# Patient Record
Sex: Female | Born: 1985
Health system: Southern US, Community
[De-identification: ages and names within clinical notes are randomized; demographics above are authoritative.]

## PROBLEM LIST (undated history)

## (undated) DIAGNOSIS — E079 Disorder of thyroid, unspecified: Secondary | ICD-10-CM

## (undated) DIAGNOSIS — E039 Hypothyroidism, unspecified: Secondary | ICD-10-CM

## (undated) DIAGNOSIS — Z5189 Encounter for other specified aftercare: Secondary | ICD-10-CM

## (undated) DIAGNOSIS — R51 Headache: Secondary | ICD-10-CM

## (undated) HISTORY — DX: Hypothyroidism, unspecified: E03.9

## (undated) HISTORY — PX: DILATION AND CURETTAGE OF UTERUS: SHX78

## (undated) HISTORY — PX: WISDOM TOOTH EXTRACTION: SHX21

## (undated) HISTORY — DX: Encounter for other specified aftercare: Z51.89

---

## 2005-08-26 ENCOUNTER — Ambulatory Visit: Payer: Self-pay | Admitting: Internal Medicine

## 2006-04-21 ENCOUNTER — Encounter: Payer: Self-pay | Admitting: Internal Medicine

## 2006-04-21 ENCOUNTER — Encounter: Admission: RE | Admit: 2006-04-21 | Discharge: 2006-04-21 | Payer: Self-pay | Admitting: Internal Medicine

## 2006-04-21 ENCOUNTER — Ambulatory Visit: Payer: Self-pay | Admitting: Internal Medicine

## 2006-04-21 DIAGNOSIS — E049 Nontoxic goiter, unspecified: Secondary | ICD-10-CM | POA: Insufficient documentation

## 2006-04-21 DIAGNOSIS — D239 Other benign neoplasm of skin, unspecified: Secondary | ICD-10-CM | POA: Insufficient documentation

## 2006-04-21 LAB — CONVERTED CEMR LAB
BUN: 11 mg/dL (ref 6–23)
Basophils Relative: 0.4 % (ref 0.0–1.0)
CO2: 27 meq/L (ref 19–32)
Creatinine, Ser: 0.4 mg/dL (ref 0.4–1.2)
Eosinophils Relative: 1 % (ref 0.0–5.0)
Glucose, Bld: 101 mg/dL — ABNORMAL HIGH (ref 70–99)
HCT: 40.3 % (ref 36.0–46.0)
HDL: 41.6 mg/dL (ref >39.0)
Hemoglobin: 14.1 g/dL (ref 12.0–15.0)
LDL Cholesterol: 60 mg/dL (ref 0–99)
Lymphocytes Relative: 30.1 % (ref 12.0–46.0)
Monocytes Absolute: 0.5 10*3/uL (ref 0.2–0.7)
Monocytes Relative: 9.7 % (ref 3.0–11.0)
Potassium: 4.5 meq/L (ref 3.5–5.1)
RDW: 11.4 % — ABNORMAL LOW (ref 11.5–14.6)
T3, Free: 8.9 pg/mL — ABNORMAL HIGH (ref 2.3–4.2)
TSH: 0.05 microintl units/mL — ABNORMAL LOW (ref 0.35–5.50)
VLDL: 8 mg/dL (ref 0–40)
WBC: 5 10*3/uL (ref 4.5–10.5)

## 2006-04-27 ENCOUNTER — Encounter: Admission: RE | Admit: 2006-04-27 | Discharge: 2006-04-27 | Payer: Self-pay | Admitting: Internal Medicine

## 2006-05-19 ENCOUNTER — Encounter: Payer: Self-pay | Admitting: Internal Medicine

## 2006-05-19 ENCOUNTER — Ambulatory Visit: Payer: Self-pay | Admitting: Internal Medicine

## 2006-05-22 ENCOUNTER — Ambulatory Visit: Payer: Self-pay | Admitting: Endocrinology

## 2006-05-26 ENCOUNTER — Ambulatory Visit: Payer: Self-pay | Admitting: Internal Medicine

## 2006-06-02 ENCOUNTER — Ambulatory Visit (HOSPITAL_COMMUNITY): Admission: RE | Admit: 2006-06-02 | Discharge: 2006-06-02 | Payer: Self-pay | Admitting: Infectious Diseases

## 2006-06-16 ENCOUNTER — Encounter: Payer: Self-pay | Admitting: Internal Medicine

## 2006-07-21 ENCOUNTER — Encounter: Admission: RE | Admit: 2006-07-21 | Discharge: 2006-07-21 | Payer: Self-pay | Admitting: Endocrinology

## 2006-08-14 ENCOUNTER — Encounter: Admission: RE | Admit: 2006-08-14 | Discharge: 2006-08-14 | Payer: Self-pay | Admitting: Endocrinology

## 2006-10-17 ENCOUNTER — Encounter: Payer: Self-pay | Admitting: *Deleted

## 2006-10-17 DIAGNOSIS — E039 Hypothyroidism, unspecified: Secondary | ICD-10-CM | POA: Insufficient documentation

## 2009-07-20 ENCOUNTER — Emergency Department (HOSPITAL_COMMUNITY): Admission: EM | Admit: 2009-07-20 | Discharge: 2009-07-20 | Payer: Self-pay | Admitting: Emergency Medicine

## 2010-02-10 ENCOUNTER — Encounter: Payer: Self-pay | Admitting: Endocrinology

## 2010-04-07 LAB — URINALYSIS, ROUTINE W REFLEX MICROSCOPIC
Glucose, UA: NEGATIVE mg/dL
Leukocytes, UA: NEGATIVE
Protein, ur: 30 mg/dL — AB
Specific Gravity, Urine: 1.033 — ABNORMAL HIGH (ref 1.005–1.030)
Urobilinogen, UA: 0.2 mg/dL (ref 0.0–1.0)

## 2010-04-07 LAB — POCT I-STAT, CHEM 8
BUN: 16 mg/dL (ref 6–23)
Calcium, Ion: 1.08 mmol/L — ABNORMAL LOW (ref 1.12–1.32)
Creatinine, Ser: 0.7 mg/dL (ref 0.4–1.2)
TCO2: 22 mmol/L (ref 0–100)

## 2010-04-07 LAB — URINE MICROSCOPIC-ADD ON

## 2010-06-04 NOTE — Consult Note (Signed)
[facility]                          ENDOCRINOLOGY CONSULTATION   Kiara Juarez, Kiara Juarez                      MRN:          [id number]  DATE:05/22/2006                            DOB:          [birth year]    REFERRING PHYSICIAN:  Willow Ora, MD   REASON FOR REFERRAL:  Hyperthyroidism.   HISTORY OF PRESENT ILLNESS:  A 25 year old woman who states that has  been noted to have swelling at her anterior neck for some years now. She  states that more recently, she has slight anxiety, weight gain and some  associated heat intolerance.   PAST MEDICAL HISTORY:  Otherwise healthy. She takes no medications. Last  menstrual period was April 27, 2006 and states she is not at risk for  pregnancy.   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   FAMILY HISTORY:  Negative for thyroid disease.   REVIEW OF SYSTEMS:  Denies the following:  Fever, shortness of breath,  dysphagia, tremor and palpitations.   PHYSICAL EXAMINATION:  VITAL SIGNS:  Blood pressure 107/57, heart rate  is 89, temperature 98.4, the weight is 154.  GENERAL:  No distress.  SKIN:  Not diaphoretic, no rash.  HEENT:  No proptosis, no periorbital swelling.  NECK:  Irregular surface, slightly enlarged on the right.  CHEST:  Clear to auscultation, no respiratory distress.  CARDIOVASCULAR:  No edema. Regular rate and rhythm, no murmur.  NEUROLOGIC:  Alert and oriented. Does not appear anxious nor depressed  and there is no tremor.   LABORATORY DATA FORWARDED BY DR Drue Novel:  On April 21, 2006, TSH 0.05, free  T4 2.7, free T3 8.9.   Thyroid ultrasound shows multiple nodules, the largest of which are 2  nodules, each 11 mm diameter, one on each thyroid lobe.   IMPRESSION:  1. Small multinodular goiter.  2. Hyperthyroidism probably due to #1.  3. Weight gain which is occasionally seen in hyperthyroidism.   PLAN:  We discussed the natural history of  hyperthyroidism, risks, and  treatment options. She wants to have a nuclear medicine scan (which  would be preparatory for iodine-131 therapy), while she considers her  options. This is being scheduled and I will let her know by telephone  when the results are known.     Sean A. Everardo All, MD  Electronically Signed    SAE/MedQ  DD: 05/22/2006  DT: 05/22/2006  Job #: [id number]   cc:   Willow Ora, MD

## 2011-03-10 ENCOUNTER — Ambulatory Visit (INDEPENDENT_AMBULATORY_CARE_PROVIDER_SITE_OTHER): Payer: 59 | Admitting: Internal Medicine

## 2011-03-10 ENCOUNTER — Encounter: Payer: Self-pay | Admitting: Internal Medicine

## 2011-03-10 VITALS — BP 110/70 | HR 91 | Temp 98.3°F | Ht 63.0 in | Wt 140.1 lb

## 2011-03-10 DIAGNOSIS — Z0000 Encounter for general adult medical examination without abnormal findings: Secondary | ICD-10-CM

## 2011-03-10 DIAGNOSIS — G44209 Tension-type headache, unspecified, not intractable: Secondary | ICD-10-CM

## 2011-03-10 DIAGNOSIS — F32A Depression, unspecified: Secondary | ICD-10-CM | POA: Insufficient documentation

## 2011-03-10 DIAGNOSIS — E039 Hypothyroidism, unspecified: Secondary | ICD-10-CM

## 2011-03-10 DIAGNOSIS — F329 Major depressive disorder, single episode, unspecified: Secondary | ICD-10-CM | POA: Insufficient documentation

## 2011-03-10 DIAGNOSIS — F3289 Other specified depressive episodes: Secondary | ICD-10-CM

## 2011-03-10 MED ORDER — BUPROPION HCL ER (XL) 150 MG PO TB24: 150.0000 mg | ORAL_TABLET | Freq: Every day | ORAL | Status: DC

## 2011-03-10 NOTE — Progress Notes (Signed)
Subjective:    Patient ID: Kiara Juarez, female    DOB: 11/27/1985, 26 y.o.   MRN: [id number]  HPI New patient to me today, here today to establish care Also, patient is here today for annual physical. Patient feels well overall.  Complaints of depression. Onset greater than 6 months ago, progressively worse especially last 6 weeks. Precipitated by increased stress with employment demands. Prior history of depression 10 years ago but never on medication or counseling for same. Associated with hypersomnia, tension headache, loss of interest in previously enjoyable things and crying spells without apparent cause. Feels hopeless but denies SI/HI. Would be agreeable to medications and counseling "if you think they will help"  Headache. See above regarding depression. Onset of same greater than 2 months ago. Describes pain as all over posterior head dropping forward to frontal area and temples. Not associated with the fovea, fever. No history of head,. No weakness, seizures or history of migraines. No medication changes. Symptoms relieved with Tylenol or otc ibuprofen. Symptoms occur 2 or 3 times weekly. Denies other change in medications or caffeine use.  Reports concern for aneurysm given paternal uncle with aneurysm and death age 90  History of hypothyroidism. Follows with endocrine Kiara Nap) for same - no recent skin or bowel or weight changes. No recent dose changes  Past Medical History  Diagnosis Date  . Hypothyroid   . Headaches, cluster    Kiara Juarez does not currently have medications on file.  Family History  Problem Relation Age of Onset  . Arthritis Other   . Breast cancer Other   . Hypertension Other   . Diabetes Other    History   Social History  . Marital Status: Single    Spouse Name: N/A    Number of Children: N/A  . Years of Education: N/A   Occupational History  . Not on file.   Social History Main Topics  . Smoking status: Former Games developer  . Smokeless tobacco:  Not on file  . Alcohol Use: Yes  . Drug Use: No  . Sexually Active: Not on file   Other Topics Concern  . Not on file   Social History Narrative  . No narrative on file     Review of Systems Constitutional: Negative for fever or weight change.  Respiratory: Negative for cough and shortness of breath.   Cardiovascular: Negative for chest pain or palpitations.  Gastrointestinal: Negative for abdominal pain, no bowel changes.  Musculoskeletal: Negative for gait problem or joint swelling.  Skin: Negative for rash or bruising.  Neurological: Negative for dizziness or seizure.  No other specific complaints in a complete review of systems (except as listed in HPI above).     Objective:   Physical Exam BP 110/70  Pulse 91  Temp(Src) 98.3 F (36.8 C) (Oral)  Ht 5\' 3"  (1.6 m)  Wt 140 lb 1.9 oz (63.558 kg)  BMI 24.82 kg/m2  SpO2 97% Wt Readings from Last 3 Encounters:  03/10/11 140 lb 1.9 oz (63.558 kg)  05/22/06 154 lb (69.854 kg)  05/19/06 153 lb (69.4 kg)   Constitutional: She appears well-developed and well-nourished. No distress.  HENT: Head: Normocephalic and atraumatic. Ears: B TMs ok, no erythema or effusion; Nose: Nose normal.  Mouth/Throat: Oropharynx is clear and moist. No oropharyngeal exudate.  Eyes: Conjunctivae and EOM are normal. Pupils are equal, round, and reactive to light. No scleral icterus.  Neck: Normal range of motion. Neck supple. No JVD present. No thyromegaly present.  Cardiovascular: Normal rate, regular rhythm and normal heart sounds.  No murmur heard. No BLE edema. Pulmonary/Chest: Effort normal and breath sounds normal. No respiratory distress. She has no wheezes.  Abdominal: Soft. Bowel sounds are normal. She exhibits no distension. There is no tenderness. no masses Musculoskeletal: Normal range of motion, no joint effusions. No gross deformities Neurological: She is alert and oriented to person, place, and time. No cranial nerve deficit. Speech is  fluent. No dysarthria Coordination and balance normal.  gait normal Skin: Skin is warm and dry. No rash noted. No erythema.  Psychiatric: She has a slightly dysphoric mood and occasional tearful affect. Her behavior is normal. Judgment and thought content normal.   Lab Results  Component Value Date   WBC 5.0 04/21/2006   HGB 16.7* 07/20/2009   HCT 49.0* 07/20/2009   PLT 367 04/21/2006   GLUCOSE 147* 07/20/2009   CHOL 110 04/21/2006   TRIG 42 04/21/2006   HDL 41.6 04/21/2006   LDLCALC 60 04/21/2006   NA 139 07/20/2009   K 3.9 07/20/2009   CL 108 07/20/2009   CREATININE 0.7 07/20/2009   BUN 16 07/20/2009   CO2 27 04/21/2006   TSH 0.05* 04/21/2006       Assessment & Plan:  CPX - v70.0 -Patient has been counseled on age-appropriate routine health concerns for screening and prevention. These are reviewed and up-to-date. Immunizations are up-to-date or declined. Labs ordered and will be reviewed.  Also See problem list. Medications and labs reviewed today.

## 2011-03-10 NOTE — Patient Instructions (Signed)
It was good to see you today. Reviewed your prior medical history today Test(s) ordered today. Your results will be called to you after review (48-72hours after test completion). If any changes need to be made, you will be notified at that time. Start low-dose extended release Wellbutrin for depression symptoms - Your prescription(s) have been submitted to your pharmacy. Please take as directed and contact our office if you believe you are having problem(s) with the medication(s). Will refer for counseling as discussed to help with depression and anxiety symptoms Continue ibuprofen or Tylenol as needed for headache symptoms Please schedule followup in 6-8 weeks to review medications and symptoms, call sooner if problems.

## 2011-03-11 DIAGNOSIS — G44209 Tension-type headache, unspecified, not intractable: Secondary | ICD-10-CM | POA: Insufficient documentation

## 2011-03-11 NOTE — Assessment & Plan Note (Signed)
Classic symptoms with history of same. Reviewed options for treatment including medical and non-medicine options. After discussion, the patient elects to begin low-dose Wellbutrin. New electronic prescription provided. Also refer to counseling with behavioral therapy. Patient questions overlap with ADD or bipolar diagnosis as other family members with history of same. We'll defer to battery testing prior to her group health as needed to help clarify if other diagnosis overlap with depression Followup in 4-6 weeks, sooner if problems. Patient counseled on possible risks and side effects of medical treatment and agrees to same

## 2011-03-11 NOTE — Assessment & Plan Note (Signed)
Diagnosis late teenage years. Follows with endocrine for same

## 2011-03-11 NOTE — Assessment & Plan Note (Signed)
Neuro exam today benign. Noted distant family history of brain aneurysm. Education to patient provided on same. Suspect tension symptoms related to depression. See above. Continue over-the-counter ibuprofen as ongoing and call if symptoms worse or unimproved. Will hopefully improve with resolution of depression

## 2011-03-17 LAB — BASIC METABOLIC PANEL
BUN: 13 mg/dL (ref 4–21)
Creatinine: 0.6 mg/dL (ref 0.5–1.1)
Glucose: 74 mg/dL
Potassium: 3.9 mmol/L (ref 3.4–5.3)
Sodium: 141 mmol/L (ref 137–147)

## 2011-03-17 LAB — HEPATIC FUNCTION PANEL
AST: 13 U/L (ref 13–35)
Bilirubin, Direct: 0.1 mg/dL (ref 0.01–0.4)

## 2011-03-17 LAB — TSH: TSH: 0.44 u[IU]/mL (ref 0.41–5.90)

## 2011-03-17 LAB — LIPID PANEL: LDL Cholesterol: 68 mg/dL

## 2011-03-19 ENCOUNTER — Encounter: Payer: Self-pay | Admitting: Internal Medicine

## 2011-04-07 ENCOUNTER — Telehealth: Payer: Self-pay | Admitting: *Deleted

## 2011-04-07 NOTE — Telephone Encounter (Signed)
Pt states she never received results back from labs done 2 weeks ago.... 04/07/11@2 :20pm/LMB

## 2011-04-07 NOTE — Telephone Encounter (Signed)
Called pt no answer lmom md response... 04/07/11@3 :35pm/LMB

## 2011-04-07 NOTE — Telephone Encounter (Signed)
All CPX labs good - no treatment changes recommended - thanks

## 2011-04-28 ENCOUNTER — Ambulatory Visit: Payer: 59 | Admitting: Internal Medicine

## 2011-08-20 ENCOUNTER — Telehealth: Payer: Self-pay | Admitting: Internal Medicine

## 2011-08-20 NOTE — Telephone Encounter (Signed)
Caller: Kiara Juarez/Patient; PCP: Rene Paci; CB#: [phone]; Call regarding Referral; Insurance has resumed and now she wants to proceed with referral to therapist or psychiatrist.  Advised follow up with office during regular hours; information noted and sent to office for follow up per standing orders as call came to daytime triage.  Reviewed notes from CPE 03/11/11 and see theat with depresson refer to counseling with behaioral therapy.  Advised to follow up with office on 8/1 per Depression protocol.    Patient denies any new sx; states she has recently started takng med Rx in Feb 2013; she declined appointment, states she needs to verivy coverage with new insurance before scheduling appointment.

## 2012-03-17 ENCOUNTER — Encounter (HOSPITAL_COMMUNITY)
Admission: RE | Disposition: A | Discharge status: Home / Self Care | Payer: Self-pay | Source: Ambulatory Visit | Attending: Obstetrics & Gynecology

## 2012-03-17 ENCOUNTER — Encounter (HOSPITAL_COMMUNITY): Payer: Self-pay | Admitting: Anesthesiology

## 2012-03-17 ENCOUNTER — Ambulatory Visit (HOSPITAL_COMMUNITY)
Admission: RE | Admit: 2012-03-17 | Discharge: 2012-03-17 | Disposition: A | Discharge status: Home / Self Care | Payer: BC Managed Care – PPO | Source: Ambulatory Visit | Attending: Obstetrics & Gynecology | Admitting: Obstetrics & Gynecology

## 2012-03-17 ENCOUNTER — Ambulatory Visit (HOSPITAL_COMMUNITY): Payer: BC Managed Care – PPO | Admitting: Anesthesiology

## 2012-03-17 ENCOUNTER — Encounter (HOSPITAL_COMMUNITY): Payer: Self-pay | Admitting: *Deleted

## 2012-03-17 DIAGNOSIS — O021 Missed abortion: Secondary | ICD-10-CM | POA: Diagnosis present

## 2012-03-17 HISTORY — PX: DILATION AND EVACUATION: SHX1459

## 2012-03-17 HISTORY — DX: Headache: R51

## 2012-03-17 LAB — CBC
MCH: 30.8 pg (ref 26.0–34.0)
MCV: 91 fL (ref 78.0–100.0)
Platelets: 257 10*3/uL (ref 150–400)
RDW: 12.7 % (ref 11.5–15.5)
WBC: 6.8 10*3/uL (ref 4.0–10.5)

## 2012-03-17 SURGERY — DILATION AND EVACUATION, UTERUS
Anesthesia: Monitor Anesthesia Care | Site: Uterus | Wound class: Clean Contaminated

## 2012-03-17 MED ORDER — FENTANYL CITRATE 0.05 MG/ML IJ SOLN
INTRAMUSCULAR | Status: AC
  Filled 2012-03-17: qty 5

## 2012-03-17 MED ORDER — PROPOFOL 10 MG/ML IV EMUL
INTRAVENOUS | Status: AC
  Filled 2012-03-17: qty 20

## 2012-03-17 MED ORDER — LACTATED RINGERS IV SOLN
INTRAVENOUS | Status: DC
  Administered 2012-03-17 (×2): via INTRAVENOUS

## 2012-03-17 MED ORDER — 0.9 % SODIUM CHLORIDE (POUR BTL) OPTIME
TOPICAL | Status: DC | PRN
  Administered 2012-03-17: 1000 mL

## 2012-03-17 MED ORDER — SODIUM CHLORIDE 0.9 % IJ SOLN: 3.0000 mL | Freq: Two times a day (BID) | INTRAMUSCULAR | Status: DC

## 2012-03-17 MED ORDER — MIDAZOLAM HCL 2 MG/2ML IJ SOLN
INTRAMUSCULAR | Status: AC
  Filled 2012-03-17: qty 2

## 2012-03-17 MED ORDER — ONDANSETRON HCL 4 MG/2ML IJ SOLN: 4.0000 mg | Freq: Four times a day (QID) | INTRAMUSCULAR | Status: DC | PRN

## 2012-03-17 MED ORDER — FENTANYL CITRATE 0.05 MG/ML IJ SOLN: 25.0000 ug | INTRAMUSCULAR | Status: DC | PRN

## 2012-03-17 MED ORDER — KETOROLAC TROMETHAMINE 30 MG/ML IJ SOLN
INTRAMUSCULAR | Status: AC
  Filled 2012-03-17: qty 1

## 2012-03-17 MED ORDER — MEPERIDINE HCL 25 MG/ML IJ SOLN: 6.2500 mg | INTRAMUSCULAR | Status: DC | PRN

## 2012-03-17 MED ORDER — PROPOFOL 10 MG/ML IV EMUL
INTRAVENOUS | Status: DC | PRN
  Administered 2012-03-17 (×4): 20 mg via INTRAVENOUS

## 2012-03-17 MED ORDER — ACETAMINOPHEN 650 MG RE SUPP: 650.0000 mg | RECTAL | Status: DC | PRN

## 2012-03-17 MED ORDER — ONDANSETRON HCL 4 MG/2ML IJ SOLN: 4.0000 mg | Freq: Once | INTRAMUSCULAR | Status: DC | PRN

## 2012-03-17 MED ORDER — DOXYCYCLINE HYCLATE 100 MG IV SOLR
200.0000 mg | Freq: Once | INTRAVENOUS | Status: AC
  Administered 2012-03-17: 200 mg via INTRAVENOUS
  Filled 2012-03-17: qty 200

## 2012-03-17 MED ORDER — ONDANSETRON HCL 4 MG/2ML IJ SOLN
INTRAMUSCULAR | Status: DC | PRN
  Administered 2012-03-17: 4 mg via INTRAVENOUS

## 2012-03-17 MED ORDER — FENTANYL CITRATE 0.05 MG/ML IJ SOLN
INTRAMUSCULAR | Status: DC | PRN
  Administered 2012-03-17: 50 ug via INTRAVENOUS
  Administered 2012-03-17: 100 ug via INTRAVENOUS
  Administered 2012-03-17: 50 ug via INTRAVENOUS

## 2012-03-17 MED ORDER — LIDOCAINE HCL (CARDIAC) 20 MG/ML IV SOLN
INTRAVENOUS | Status: AC
  Filled 2012-03-17: qty 5

## 2012-03-17 MED ORDER — KETOROLAC TROMETHAMINE 30 MG/ML IJ SOLN: 15.0000 mg | Freq: Once | INTRAMUSCULAR | Status: DC | PRN

## 2012-03-17 MED ORDER — DEXAMETHASONE SODIUM PHOSPHATE 10 MG/ML IJ SOLN
INTRAMUSCULAR | Status: DC | PRN
  Administered 2012-03-17: 10 mg via INTRAVENOUS

## 2012-03-17 MED ORDER — OXYCODONE HCL 5 MG PO TABS: 5.0000 mg | ORAL_TABLET | ORAL | Status: DC | PRN

## 2012-03-17 MED ORDER — METHYLERGONOVINE MALEATE 0.2 MG PO TABS: 0.2000 mg | ORAL_TABLET | Freq: Four times a day (QID) | ORAL | Status: DC

## 2012-03-17 MED ORDER — ONDANSETRON HCL 4 MG/2ML IJ SOLN
INTRAMUSCULAR | Status: AC
  Filled 2012-03-17: qty 2

## 2012-03-17 MED ORDER — MIDAZOLAM HCL 5 MG/5ML IJ SOLN
INTRAMUSCULAR | Status: DC | PRN
  Administered 2012-03-17: 2 mg via INTRAVENOUS

## 2012-03-17 MED ORDER — SODIUM CHLORIDE 0.9 % IV SOLN: 250.0000 mL | INTRAVENOUS | Status: DC | PRN

## 2012-03-17 MED ORDER — KETOROLAC TROMETHAMINE 30 MG/ML IJ SOLN
INTRAMUSCULAR | Status: DC | PRN
  Administered 2012-03-17: 30 mg via INTRAVENOUS

## 2012-03-17 MED ORDER — LIDOCAINE HCL (CARDIAC) 20 MG/ML IV SOLN
INTRAVENOUS | Status: DC | PRN
  Administered 2012-03-17: 50 mg via INTRAVENOUS

## 2012-03-17 MED ORDER — ACETAMINOPHEN 325 MG PO TABS: 650.0000 mg | ORAL_TABLET | ORAL | Status: DC | PRN

## 2012-03-17 MED ORDER — SODIUM CHLORIDE 0.9 % IJ SOLN: 3.0000 mL | INTRAMUSCULAR | Status: DC | PRN

## 2012-03-17 MED ORDER — DEXAMETHASONE SODIUM PHOSPHATE 10 MG/ML IJ SOLN
INTRAMUSCULAR | Status: AC
  Filled 2012-03-17: qty 1

## 2012-03-17 MED ORDER — LIDOCAINE HCL 1 % IJ SOLN
INTRAMUSCULAR | Status: DC | PRN
  Administered 2012-03-17: 10 mL

## 2012-03-17 SURGICAL SUPPLY — 19 items
CATH ROBINSON RED A/P 16FR (CATHETERS) ×2 IMPLANT
CLOTH BEACON ORANGE TIMEOUT ST (SAFETY) ×2 IMPLANT
DECANTER SPIKE VIAL GLASS SM (MISCELLANEOUS) ×2 IMPLANT
GLOVE BIO SURGEON STRL SZ 6.5 (GLOVE) ×2 IMPLANT
GOWN STRL REIN XL XLG (GOWN DISPOSABLE) ×4 IMPLANT
KIT BERKELEY 1ST TRIMESTER 3/8 (MISCELLANEOUS) ×2 IMPLANT
NDL SPNL 22GX3.5 QUINCKE BK (NEEDLE) ×1 IMPLANT
NEEDLE SPNL 22GX3.5 QUINCKE BK (NEEDLE) ×2 IMPLANT
NS IRRIG 1000ML POUR BTL (IV SOLUTION) ×2 IMPLANT
PACK VAGINAL MINOR WOMEN LF (CUSTOM PROCEDURE TRAY) ×2 IMPLANT
PAD OB MATERNITY 4.3X12.25 (PERSONAL CARE ITEMS) ×2 IMPLANT
PAD PREP 24X48 CUFFED NSTRL (MISCELLANEOUS) ×2 IMPLANT
SET BERKELEY SUCTION TUBING (SUCTIONS) ×2 IMPLANT
SYR CONTROL 10ML LL (SYRINGE) ×2 IMPLANT
TOWEL OR 17X24 6PK STRL BLUE (TOWEL DISPOSABLE) ×4 IMPLANT
VACURETTE 10 RIGID CVD (CANNULA) IMPLANT
VACURETTE 7MM CVD STRL WRAP (CANNULA) IMPLANT
VACURETTE 8 RIGID CVD (CANNULA) IMPLANT
VACURETTE 9 RIGID CVD (CANNULA) IMPLANT

## 2012-03-17 NOTE — H&P (Signed)
  Chief Complaint: 27 y.o.  who presents with an early pregnancy failure  Details of Present Illness: A recent U/S showed a blighted ovum.  She opted for cytotec and failed to pass the POC.  BP 110/71  Pulse 78  Temp(Src) 98.6 F (37 C) (Oral)  Resp 18  Ht 5\' 3"  (1.6 m)  Wt 65.772 kg (145 lb)  BMI 25.69 kg/m2  SpO2 100%  LMP 01/20/2012  Past Medical History  Diagnosis Date  . Hypothyroid   . Headache     otc meds prn   History   Social History  . Marital Status: Single    Spouse Name: N/A    Number of Children: N/A  . Years of Education: N/A   Occupational History  . Not on file.   Social History Main Topics  . Smoking status: Former Smoker -- 0.25 packs/day for 5 years    Types: Cigarettes  . Smokeless tobacco: Never Used  . Alcohol Use: Yes     Comment: socially  . Drug Use: No  . Sexually Active: Yes    Birth Control/ Protection: None     Comment: pregnant [redacted] wks per patient   Other Topics Concern  . Not on file   Social History Narrative  . No narrative on file   Family History  Problem Relation Age of Onset  . Arthritis Other   . Breast cancer Other   . Hypertension Other   . Diabetes Other     Pertinent items are noted in HPI.  Pre-Op Diagnosis: missed abortion [id number]   Planned Procedure: Procedure(s): DILATATION AND EVACUATION  I have reviewed the patient's history and have completed the physical exam and Kiara Juarez is acceptable for surgery.  Roseanna Rainbow, MD 03/17/2012 12:34 PM

## 2012-03-17 NOTE — Op Note (Signed)
Preoperative diagnosis: Missed abortion  Postoperative diagnosis: Missed abortion  Procedure: Suction dilatation and curretage Surgeon: Antionette Char A  Anesthesia: MAC/paracervical block  Estimated blood loss: minimal  Urine output: minimal  IV Fluids: per Anesthesiology  Complications: none  Specimen: PATHOLOGY  Operative Findings: Moderate products of conception retrieved.  Description of procedure:   The patient was taken to the operating room and placed on the operating table in the semi-lithotomy position in [place] stirrups.  Examination under anesthesia was performed.  The patient was prepped and draped in the usual manner.  After a time-out had been completed, a speculum was placed in the vagina.  The anterior lip of the cervix was grasped with a single-toothed tenaculum.  A paracervical block was performed using 10 ml of 1% lidocaine.  The block was performed at 4 and 8 o'clock at the cervical vaginal junction. The cervix was dilated with Shawnie Pons dilators.  A 7 -mm suction curet was inserted in the uterine cavity.  The device was activated and the curet rotated to evacuate the products of conception.   All the instruments were removed from the vagina.  Final instrument counts were correct.  The patient was taken to the PACU in stable condition.

## 2012-03-17 NOTE — Anesthesia Preprocedure Evaluation (Signed)
Anesthesia Evaluation  Patient identified by MRN, date of birth, ID band Patient awake    Reviewed: Allergy & Precautions, H&P , NPO status , Patient's Chart, lab work & pertinent test results  Airway Mallampati: I TM Distance: >3 FB Neck ROM: full    Dental no notable dental hx. (+) Teeth Intact   Pulmonary neg pulmonary ROS,          Cardiovascular negative cardio ROS      Neuro/Psych    GI/Hepatic negative GI ROS, Neg liver ROS,   Endo/Other  Hypothyroidism   Renal/GU negative Renal ROS  negative genitourinary   Musculoskeletal negative musculoskeletal ROS (+)   Abdominal Normal abdominal exam  (+)   Peds negative pediatric ROS (+)  Hematology negative hematology ROS (+)   Anesthesia Other Findings   Reproductive/Obstetrics negative OB ROS                           Anesthesia Physical Anesthesia Plan  ASA: II  Anesthesia Plan: MAC   Post-op Pain Management:    Induction: Intravenous  Airway Management Planned:   Additional Equipment:   Intra-op Plan:   Post-operative Plan:   Informed Consent: I have reviewed the patients History and Physical, chart, labs and discussed the procedure including the risks, benefits and alternatives for the proposed anesthesia with the patient or authorized representative who has indicated his/her understanding and acceptance.     Plan Discussed with: CRNA and Surgeon  Anesthesia Plan Comments:         Anesthesia Quick Evaluation

## 2012-03-17 NOTE — Anesthesia Postprocedure Evaluation (Signed)
Anesthesia Post Note  Patient: Kiara Juarez  Procedure(s) Performed: Procedure(s) (LRB): DILATATION AND EVACUATION (N/A)  Anesthesia type: MAC  Patient location: PACU  Post pain: Pain level controlled  Post assessment: Post-op Vital signs reviewed  Last Vitals:  Filed Vitals:   03/17/12 1128  BP: 110/71  Pulse: 78  Temp: 37 C  Resp: 18    Post vital signs: Reviewed  Level of consciousness: sedated  Complications: No apparent anesthesia complications

## 2012-03-17 NOTE — Transfer of Care (Signed)
Immediate Anesthesia Transfer of Care Note  Patient: Kiara Juarez  Procedure(s) Performed: Procedure(s): DILATATION AND EVACUATION (N/A)  Immediate Anesthesia Transfer of Care Note  Patient: Kiara Juarez  Procedure(s) Performed: Procedure(s): DILATATION AND EVACUATION (N/A)  Patient Location: PACU  Anesthesia Type:General  Level of Consciousness: awake  Airway & Oxygen Therapy: Patient Spontanous Breathing  Post-op Assessment: Report given to PACU RN  Post vital signs: stable  Filed Vitals:   03/17/12 1128  BP: 110/71  Pulse: 78  Temp: 37 C  Resp: 18    Complications: No apparent anesthesia complications

## 2012-03-18 ENCOUNTER — Encounter (HOSPITAL_COMMUNITY): Payer: Self-pay | Admitting: Obstetrics & Gynecology

## 2012-04-02 ENCOUNTER — Encounter (HOSPITAL_COMMUNITY)
Admission: AD | Disposition: A | Discharge status: Home / Self Care | Payer: Self-pay | Source: Ambulatory Visit | Attending: Obstetrics & Gynecology

## 2012-04-02 ENCOUNTER — Other Ambulatory Visit: Payer: Self-pay

## 2012-04-02 ENCOUNTER — Encounter (HOSPITAL_COMMUNITY): Payer: Self-pay | Admitting: *Deleted

## 2012-04-02 ENCOUNTER — Ambulatory Visit: Admit: 2012-04-02 | Payer: Self-pay | Admitting: Obstetrics & Gynecology

## 2012-04-02 ENCOUNTER — Inpatient Hospital Stay (HOSPITAL_COMMUNITY): Payer: BC Managed Care – PPO

## 2012-04-02 ENCOUNTER — Inpatient Hospital Stay (HOSPITAL_COMMUNITY)
Admission: AD | Admit: 2012-04-02 | Discharge: 2012-04-05 | DRG: 378 | Disposition: A | Discharge status: Home / Self Care | Payer: BC Managed Care – PPO | Source: Ambulatory Visit | Attending: Obstetrics & Gynecology | Admitting: Obstetrics & Gynecology

## 2012-04-02 ENCOUNTER — Encounter (HOSPITAL_COMMUNITY): Payer: Self-pay | Admitting: Anesthesiology

## 2012-04-02 ENCOUNTER — Inpatient Hospital Stay (HOSPITAL_COMMUNITY): Payer: BC Managed Care – PPO | Admitting: Anesthesiology

## 2012-04-02 DIAGNOSIS — D62 Acute posthemorrhagic anemia: Secondary | ICD-10-CM | POA: Diagnosis present

## 2012-04-02 DIAGNOSIS — O00109 Unspecified tubal pregnancy without intrauterine pregnancy: Principal | ICD-10-CM | POA: Diagnosis present

## 2012-04-02 DIAGNOSIS — R109 Unspecified abdominal pain: Secondary | ICD-10-CM | POA: Diagnosis present

## 2012-04-02 DIAGNOSIS — K661 Hemoperitoneum: Secondary | ICD-10-CM | POA: Diagnosis present

## 2012-04-02 DIAGNOSIS — D649 Anemia, unspecified: Secondary | ICD-10-CM | POA: Diagnosis present

## 2012-04-02 HISTORY — PX: UNILATERAL SALPINGECTOMY: SHX6160

## 2012-04-02 HISTORY — PX: LAPAROTOMY: SHX154

## 2012-04-02 LAB — CBC
HCT: 37.1 % (ref 36.0–46.0)
HCT: 27.7 % — ABNORMAL LOW (ref 36.0–46.0)
Hemoglobin: 12.6 g/dL (ref 12.0–15.0)
Hemoglobin: 9.5 g/dL — ABNORMAL LOW (ref 12.0–15.0)
MCH: 30.1 pg (ref 26.0–34.0)
MCHC: 34.3 g/dL (ref 30.0–36.0)
WBC: 7.3 10*3/uL (ref 4.0–10.5)

## 2012-04-02 LAB — BASIC METABOLIC PANEL
BUN: 11 mg/dL (ref 6–23)
Calcium: 6.4 mg/dL — CL (ref 8.4–10.5)
Creatinine, Ser: 0.57 mg/dL (ref 0.50–1.10)
GFR calc non Af Amer: >90 mL/min (ref >90)
Glucose, Bld: 135 mg/dL — ABNORMAL HIGH (ref 70–99)

## 2012-04-02 LAB — PROTIME-INR
INR: 1.34 (ref 0.00–1.49)
Prothrombin Time: 16.3 seconds — ABNORMAL HIGH (ref 11.6–15.2)

## 2012-04-02 LAB — HCG, QUANTITATIVE, PREGNANCY: hCG, Beta Chain, Quant, S: 1509 m[IU]/mL — ABNORMAL HIGH (ref <5)

## 2012-04-02 LAB — DIC (DISSEMINATED INTRAVASCULAR COAGULATION)PANEL
Platelets: 166 10*3/uL (ref 150–400)
Smear Review: NONE SEEN

## 2012-04-02 LAB — APTT: aPTT: 27 seconds (ref 24–37)

## 2012-04-02 LAB — MRSA PCR SCREENING: MRSA by PCR: NEGATIVE

## 2012-04-02 SURGERY — LAPAROTOMY, EXPLORATORY
Anesthesia: General | Site: Abdomen | Wound class: Clean Contaminated

## 2012-04-02 MED ORDER — EPHEDRINE 5 MG/ML INJ
INTRAVENOUS | Status: AC
  Filled 2012-04-02: qty 10

## 2012-04-02 MED ORDER — HYDROMORPHONE HCL PF 1 MG/ML IJ SOLN
1.0000 mg | INTRAMUSCULAR | Status: AC
  Administered 2012-04-02: 1 mg via INTRAVENOUS
  Filled 2012-04-02: qty 1

## 2012-04-02 MED ORDER — LIDOCAINE HCL (CARDIAC) 20 MG/ML IV SOLN
INTRAVENOUS | Status: DC | PRN
  Administered 2012-04-02: 50 mg via INTRAVENOUS

## 2012-04-02 MED ORDER — PHENYLEPHRINE 40 MCG/ML (10ML) SYRINGE FOR IV PUSH (FOR BLOOD PRESSURE SUPPORT)
PREFILLED_SYRINGE | INTRAVENOUS | Status: AC
  Filled 2012-04-02: qty 10

## 2012-04-02 MED ORDER — FENTANYL CITRATE 0.05 MG/ML IJ SOLN
INTRAMUSCULAR | Status: DC | PRN
  Administered 2012-04-02: 50 ug via INTRAVENOUS
  Administered 2012-04-02: 100 ug via INTRAVENOUS
  Administered 2012-04-02: 50 ug via INTRAVENOUS

## 2012-04-02 MED ORDER — HYDROMORPHONE HCL PF 1 MG/ML IJ SOLN
0.2500 mg | Freq: Once | INTRAMUSCULAR | Status: DC
  Administered 2012-04-02: 0.25 mg via INTRAVENOUS

## 2012-04-02 MED ORDER — METOCLOPRAMIDE HCL 5 MG/ML IJ SOLN: 10.0000 mg | Freq: Once | INTRAMUSCULAR | Status: DC | PRN

## 2012-04-02 MED ORDER — ONDANSETRON HCL 4 MG/2ML IJ SOLN
INTRAMUSCULAR | Status: DC | PRN
  Administered 2012-04-02: 4 mg via INTRAVENOUS

## 2012-04-02 MED ORDER — GLYCOPYRROLATE 0.2 MG/ML IJ SOLN
INTRAMUSCULAR | Status: DC | PRN
  Administered 2012-04-02: 0.4 mg via INTRAVENOUS

## 2012-04-02 MED ORDER — FAMOTIDINE IN NACL 20-0.9 MG/50ML-% IV SOLN
INTRAVENOUS | Status: AC
  Administered 2012-04-02: 20 mg via INTRAVENOUS
  Filled 2012-04-02: qty 50

## 2012-04-02 MED ORDER — SODIUM CHLORIDE 0.9 % IV BOLUS (SEPSIS)
1000.0000 mL | Freq: Once | INTRAVENOUS | Status: AC
  Administered 2012-04-02: 1000 mL via INTRAVENOUS

## 2012-04-02 MED ORDER — NEOSTIGMINE METHYLSULFATE 1 MG/ML IJ SOLN
INTRAMUSCULAR | Status: DC | PRN
  Administered 2012-04-02: 2 mg via INTRAVENOUS

## 2012-04-02 MED ORDER — HYDROMORPHONE HCL PF 1 MG/ML IJ SOLN
INTRAMUSCULAR | Status: AC
  Administered 2012-04-02: 0.5 mg via INTRAVENOUS
  Filled 2012-04-02: qty 1

## 2012-04-02 MED ORDER — LACTATED RINGERS IV SOLN
INTRAVENOUS | Status: DC | PRN
  Administered 2012-04-02 (×3): via INTRAVENOUS

## 2012-04-02 MED ORDER — EPHEDRINE SULFATE 50 MG/ML IJ SOLN
INTRAMUSCULAR | Status: DC | PRN
  Administered 2012-04-02: 10 mg via INTRAVENOUS
  Administered 2012-04-02: 5 mg via INTRAVENOUS

## 2012-04-02 MED ORDER — FAMOTIDINE IN NACL 20-0.9 MG/50ML-% IV SOLN: 20.0000 mg | Freq: Once | INTRAVENOUS | Status: AC

## 2012-04-02 MED ORDER — MEPERIDINE HCL 25 MG/ML IJ SOLN: 6.2500 mg | INTRAMUSCULAR | Status: DC | PRN

## 2012-04-02 MED ORDER — CITRIC ACID-SODIUM CITRATE 334-500 MG/5ML PO SOLN: 30.0000 mL | Freq: Once | ORAL | Status: AC

## 2012-04-02 MED ORDER — SODIUM CHLORIDE 0.9 % IJ SOLN
INTRAMUSCULAR | Status: AC
  Filled 2012-04-02: qty 3

## 2012-04-02 MED ORDER — HYDROMORPHONE HCL PF 1 MG/ML IJ SOLN
0.2500 mg | INTRAMUSCULAR | Status: DC | PRN
  Administered 2012-04-02 (×3): 0.5 mg via INTRAVENOUS

## 2012-04-02 MED ORDER — ROCURONIUM BROMIDE 100 MG/10ML IV SOLN
INTRAVENOUS | Status: DC | PRN
  Administered 2012-04-02: 20 mg via INTRAVENOUS

## 2012-04-02 MED ORDER — FENTANYL CITRATE 0.05 MG/ML IJ SOLN
INTRAMUSCULAR | Status: AC
  Filled 2012-04-02: qty 5

## 2012-04-02 MED ORDER — LACTATED RINGERS IV BOLUS (SEPSIS): 1000.0000 mL | Freq: Once | INTRAVENOUS | Status: DC

## 2012-04-02 MED ORDER — HYDROMORPHONE HCL PF 1 MG/ML IJ SOLN
INTRAMUSCULAR | Status: AC
  Filled 2012-04-02: qty 1

## 2012-04-02 MED ORDER — PHENYLEPHRINE HCL 10 MG/ML IJ SOLN
INTRAMUSCULAR | Status: DC | PRN
  Administered 2012-04-02 (×2): 80 mg via INTRAVENOUS
  Administered 2012-04-02 (×2): 40 mg via INTRAVENOUS
  Administered 2012-04-02 (×5): 80 mg via INTRAVENOUS

## 2012-04-02 MED ORDER — CEFAZOLIN SODIUM-DEXTROSE 2-3 GM-% IV SOLR
INTRAVENOUS | Status: DC | PRN
  Administered 2012-04-02: 2 g via INTRAVENOUS

## 2012-04-02 MED ORDER — HYDROMORPHONE 0.3 MG/ML IV SOLN
INTRAVENOUS | Status: DC
  Administered 2012-04-03: 0.6 mg via INTRAVENOUS
  Administered 2012-04-03: 1.8 mg via INTRAVENOUS
  Filled 2012-04-02: qty 25

## 2012-04-02 MED ORDER — STERILE WATER FOR IRRIGATION IR SOLN
Status: DC | PRN
  Administered 2012-04-02 (×2): 1000 mL

## 2012-04-02 MED ORDER — SODIUM CHLORIDE 0.9 % IV SOLN
INTRAVENOUS | Status: DC
  Administered 2012-04-02 – 2012-04-03 (×5): via INTRAVENOUS

## 2012-04-02 MED ORDER — CEFAZOLIN SODIUM-DEXTROSE 2-3 GM-% IV SOLR
INTRAVENOUS | Status: AC
  Filled 2012-04-02: qty 50

## 2012-04-02 MED ORDER — SUCCINYLCHOLINE CHLORIDE 20 MG/ML IJ SOLN
INTRAMUSCULAR | Status: DC | PRN
  Administered 2012-04-02: 120 mg via INTRAVENOUS

## 2012-04-02 MED ORDER — PHENYLEPHRINE 40 MCG/ML (10ML) SYRINGE FOR IV PUSH (FOR BLOOD PRESSURE SUPPORT)
PREFILLED_SYRINGE | INTRAVENOUS | Status: AC
  Filled 2012-04-02: qty 5

## 2012-04-02 MED ORDER — MIDAZOLAM HCL 2 MG/2ML IJ SOLN
INTRAMUSCULAR | Status: AC
  Filled 2012-04-02: qty 2

## 2012-04-02 MED ORDER — PROPOFOL 10 MG/ML IV EMUL
INTRAVENOUS | Status: DC | PRN
  Administered 2012-04-02: 130 mg via INTRAVENOUS

## 2012-04-02 MED ORDER — CITRIC ACID-SODIUM CITRATE 334-500 MG/5ML PO SOLN
ORAL | Status: AC
  Administered 2012-04-02: 30 mL via ORAL
  Filled 2012-04-02: qty 15

## 2012-04-02 MED ORDER — 0.9 % SODIUM CHLORIDE (POUR BTL) OPTIME
TOPICAL | Status: DC | PRN
  Administered 2012-04-02: 1000 mL

## 2012-04-02 SURGICAL SUPPLY — 35 items
BAG SPEC RTRVL LRG 6X4 10 (ENDOMECHANICALS)
CABLE HIGH FREQUENCY MONO STRZ (ELECTRODE) IMPLANT
CATH ROBINSON RED A/P 16FR (CATHETERS) ×4 IMPLANT
CHLORAPREP W/TINT 26ML (MISCELLANEOUS) ×4 IMPLANT
CLOTH BEACON ORANGE TIMEOUT ST (SAFETY) ×4 IMPLANT
CONT SPECI 4OZ STER CLIK (MISCELLANEOUS) IMPLANT
DRESSING TELFA 8X3 (GAUZE/BANDAGES/DRESSINGS) ×2 IMPLANT
GAUZE SPONGE 4X4 12PLY STRL LF (GAUZE/BANDAGES/DRESSINGS) ×2 IMPLANT
GLOVE BIO SURGEON STRL SZ 6.5 (GLOVE) ×4 IMPLANT
GOWN PREVENTION PLUS LG XLONG (DISPOSABLE) ×8 IMPLANT
NS IRRIG 1000ML POUR BTL (IV SOLUTION) ×4 IMPLANT
PACK LAPAROSCOPY BASIN (CUSTOM PROCEDURE TRAY) ×4 IMPLANT
PAD ABD 7.5X8 STRL (GAUZE/BANDAGES/DRESSINGS) ×2 IMPLANT
POUCH SPECIMEN RETRIEVAL 10MM (ENDOMECHANICALS) IMPLANT
PROTECTOR NERVE ULNAR (MISCELLANEOUS) ×4 IMPLANT
SEALER TISSUE G2 CVD JAW 35 (ENDOMECHANICALS) IMPLANT
SEALER TISSUE G2 CVD JAW 45CM (ENDOMECHANICALS) IMPLANT
SET IRRIG TUBING LAPAROSCOPIC (IRRIGATION / IRRIGATOR) IMPLANT
STRIP CLOSURE SKIN 1/2X4 (GAUZE/BANDAGES/DRESSINGS) ×2 IMPLANT
SUT MNCRL AB 3-0 PS2 27 (SUTURE) ×2 IMPLANT
SUT MNCRL AB 4-0 PS2 18 (SUTURE) IMPLANT
SUT VIC AB 0 CT1 18XCR BRD8 (SUTURE) ×1 IMPLANT
SUT VIC AB 0 CT1 27 (SUTURE) ×8
SUT VIC AB 0 CT1 27XBRD ANBCTR (SUTURE) ×2 IMPLANT
SUT VIC AB 0 CT1 8-18 (SUTURE) ×4
SUT VIC AB 2-0 CT1 27 (SUTURE) ×4
SUT VIC AB 2-0 CT1 TAPERPNT 27 (SUTURE) ×1 IMPLANT
SUT VICRYL 0 UR6 27IN ABS (SUTURE) ×4 IMPLANT
SUT VICRYL 4-0 PS2 18IN ABS (SUTURE) IMPLANT
TAPE CLOTH SURG 4X10 WHT LF (GAUZE/BANDAGES/DRESSINGS) ×2 IMPLANT
TOWEL OR 17X24 6PK STRL BLUE (TOWEL DISPOSABLE) ×8 IMPLANT
TRAY FOLEY CATH 14FR (SET/KITS/TRAYS/PACK) IMPLANT
TROCAR XCEL NON-BLD 11X100MML (ENDOMECHANICALS) ×4 IMPLANT
TROCAR XCEL NON-BLD 5MMX100MML (ENDOMECHANICALS) ×8 IMPLANT
WATER STERILE IRR 1000ML POUR (IV SOLUTION) ×4 IMPLANT

## 2012-04-02 NOTE — MAU Note (Signed)
Pt brought in by staff member by [facility].  States she had D&C by Dr. Tamela Oddi 2 weeks ago for miscarriage.  Pt C/O severe abd pain for past 20-30 minutes, some pain this a.m.  Pt states she has had bleeding on & off since her procedure.

## 2012-04-02 NOTE — Anesthesia Postprocedure Evaluation (Signed)
Anesthesia Post Note  Patient: Kiara Juarez  Procedure(s) Performed: Procedure(s) (LRB): EXPLORATORY LAPAROTOMY (N/A) UNILATERAL SALPINGECTOMY (Left)  Anesthesia type: General  Patient location: PACU  Post pain: Pain level controlled  Post assessment: Post-op Vital signs reviewed  Last Vitals:  Filed Vitals:   04/02/12 1930  BP: 98/63  Pulse: 84  Temp: 36.1 C  Resp: 14    Post vital signs: Reviewed  Level of consciousness: sedated  Complications: No apparent anesthesia complications

## 2012-04-02 NOTE — Transfer of Care (Signed)
Immediate Anesthesia Transfer of Care Note  Patient: Kiara Juarez  Procedure(s) Performed: Procedure(s) with comments: EXPLORATORY LAPAROTOMY (N/A) UNILATERAL SALPINGECTOMY (Left) - partial salpingectomy  Patient Location: PACU  Anesthesia Type:General  Level of Consciousness: awake, alert , oriented and patient cooperative  Airway & Oxygen Therapy: Patient Spontanous Breathing and Patient connected to nasal cannula oxygen  Post-op Assessment: Report given to PACU RN and Post -op Vital signs reviewed and stable  Post vital signs: Reviewed and stable  Complications: No apparent anesthesia complications

## 2012-04-02 NOTE — MAU Note (Signed)
Pt feeling nauseous, sat up to vomit, became dizzy, pale.  Pt back to trenlenberg position, BP 83/50.

## 2012-04-02 NOTE — Op Note (Signed)
Exploratory Laparotomy, Partial Salpingectomy  Procedure Note  Indications: Hemoperitoneum, acute abdomen  Pre-operative Diagnosis: Same  Post-operative Diagnosis: Same, ruptured left tubal ectopic pregnancy  Operation: See above, left, partial salpingectomy  Surgeon: Roseanna Rainbow   Assistants: Coral Ceo A  Anesthesia: General endotracheal anesthesia  ASA Class: 1  Procedure Details  The patient was seen in the Holding Room. The risks, benefits, complications, treatment options, and expected outcomes were discussed with the patient.  The patient concurred with the proposed plan, giving informed consent.  . The patient was taken to Operating Room # 4, identified as Mammie Russian and the procedure verified as exploratory laparotoy. A Time Out was held and the above information confirmed.  Patient brought to the operating room and after satisfactory attainment of general anesthesia was placed in a modified lithotomy position in [place] stirrups. Anterior abdominal wall perineum and vagina were prepped a Foley catheter was inserted the patient was draped. Antibiotics were administered. The abdomen was entered through a Pfannenstiel incision. The upper abdomen and the pelvis were explored with the findings noted below. An  Lenox Ahr retractor was positioned and the small bowel packed out of the pelvis. The fallopian tube proximal to the ectopic was clamped and cut.  The underlying mesosalpinx and distal fallopian tube were serially clamped and cut.  The pedicles were suture ligated with 2-0 Vicryl.  There was approximately 5 mm of proximal tube and several centimeters in length distally.  The ovaries, uterus and right fallopian tube were normal in appearance.  Approximately 2 L of blood and clot were evacuated from the abdomen and pelvis.   The pelvis was irrigated. Hemostasis was noted.  The fascia was closed using 0- Vicryl. Subcutaneous tissue was irrigated and hemostasis  was noted and the skin closed with skin suture. A dressing was applied. The patient was awakened from anesthesia and taken to recovery in satisfactory condition sponge needle and instrument counts correct x2   Findings: See above  Estimated Blood Loss:  Minimal                  Total IV Fluids: per Anesthesiology         Specimens: Portion of left fallopian tube                  Complications:  None; patient tolerated the procedure well.         Disposition: ICU - extubated and stable.         Condition: stable

## 2012-04-02 NOTE — H&P (Signed)
   Chief Complaint: 27 y.o.  who presents with acute abdominal pain  Details of Present Illness: She is 2 weeks s/p a suction D&C for a first trimester D&C.  She has had mild cramping since the procedure.  She had sudden onset of lower abdominal pain earlier today.  She has associated dizziness.  BP 83/50  Pulse 114  Temp(Src) 97.3 F (36.3 C) (Oral)  Resp 18  SpO2 100%  LMP 01/20/2012  Breastfeeding? Unknown  Past Medical History  Diagnosis Date  . Hypothyroid   . Headache     otc meds prn   History   Social History  . Marital Status: Single    Spouse Name: N/A    Number of Children: N/A  . Years of Education: N/A   Occupational History  . Not on file.   Social History Main Topics  . Smoking status: Former Smoker -- 0.25 packs/day for 5 years    Types: Cigarettes  . Smokeless tobacco: Never Used  . Alcohol Use: Yes     Comment: socially  . Drug Use: No  . Sexually Active: Yes    Birth Control/ Protection: None     Comment: pregnant [redacted] wks per patient   Other Topics Concern  . Not on file   Social History Narrative  . No narrative on file   Family History  Problem Relation Age of Onset  . Arthritis Other   . Breast cancer Other   . Hypertension Other   . Diabetes Other     Pertinent items are noted in HPI. Results for orders placed during the hospital encounter of 04/02/12 (from the past 24 hour(s))  CBC     Status: None   Collection Time    04/02/12  2:43 PM      Result Value Range   WBC 7.3  4.0 - 10.5 K/uL   RBC 4.13  3.87 - 5.11 MIL/uL   Hemoglobin 12.6  12.0 - 15.0 g/dL   HCT 40.9  81.1 - 91.4 %   MCV 89.8  78.0 - 100.0 fL   MCH 30.5  26.0 - 34.0 pg   MCHC 34.0  30.0 - 36.0 g/dL   RDW 78.2  95.6 - 21.3 %   Platelets 329  150 - 400 K/uL   Pre-Op Diagnosis:  Acute abdomen, hemoperitoneum.  Orthostatic. DDx ruptured CLC, ectopic  Diagnostic/operative laparoscopy; possible laparotomy  I have reviewed the patient's history and have  completed the physical exam and REANNA SCOGGIN is acceptable for surgery.  Roseanna Rainbow, MD 04/02/2012 5:00 PM

## 2012-04-02 NOTE — Anesthesia Preprocedure Evaluation (Addendum)
Anesthesia Evaluation  Patient identified by MRN, date of birth, ID band Patient awake    Reviewed: Allergy & Precautions, H&P , NPO status , Patient's Chart, lab work & pertinent test results  Airway Mallampati: III TM Distance: >3 FB Neck ROM: Full    Dental  (+) Teeth Intact and Chipped,    Pulmonary neg pulmonary ROS,  breath sounds clear to auscultation  Pulmonary exam normal       Cardiovascular Rhythm:Regular Rate:Tachycardia  Hypovolemic Shock   Neuro/Psych  Headaches, PSYCHIATRIC DISORDERS Depression    GI/Hepatic Neg liver ROS,   Endo/Other  Hypothyroidism   Renal/GU negative Renal ROS  negative genitourinary   Musculoskeletal negative musculoskeletal ROS (+)   Abdominal (+)  Abdomen: rigid and tender.    Peds  Hematology negative hematology ROS (+)   Anesthesia Other Findings   Reproductive/Obstetrics Acute abdomen, suspected ruptured ectopic pregnancy                           Anesthesia Physical Anesthesia Plan  ASA: II and emergent  Anesthesia Plan: General   Post-op Pain Management:    Induction: Intravenous, Rapid sequence and Cricoid pressure planned  Airway Management Planned: Oral ETT  Additional Equipment:   Intra-op Plan:   Post-operative Plan: Extubation in OR  Informed Consent: I have reviewed the patients History and Physical, chart, labs and discussed the procedure including the risks, benefits and alternatives for the proposed anesthesia with the patient or authorized representative who has indicated his/her understanding and acceptance.   Dental advisory given  Plan Discussed with: CRNA, Anesthesiologist and Surgeon  Anesthesia Plan Comments: (Patient in shock, taken to OR 4 emergently. O negative emergency release blood requested.)       Anesthesia Quick Evaluation

## 2012-04-02 NOTE — MAU Provider Note (Signed)
Chief Complaint: Abdominal Pain   First Aquan Kope Initiated Contact with Patient 04/02/12 1439     SUBJECTIVE HPI: Kiara Juarez is a 27 y.o. G1P0  who presents to maternity admissions brought in by security from parking lot crying in pain.  Pt reports moderate vaginal bleeding soaking 1 pantyliner per hour and severe abdominal pain starting today.  She had a D&C by Dr Tamela Oddi 2 weeks ago and has had no pain since 1 day after the procedure until now.  She reports dizziness, chest pain, and nausea since arrival in MAU.  She denies vaginal itching/burning, urinary symptoms, h/a, dizziness, n/v, or fever/chills.     Past Medical History  Diagnosis Date  . Hypothyroid   . Headache     otc meds prn   Past Surgical History  Procedure Laterality Date  . No past surgeries    . Dilation and evacuation N/A 03/17/2012    Procedure: DILATATION AND EVACUATION;  Surgeon: Antionette Char, MD;  Location: [facility];  Service: Gynecology;  Laterality: N/A;   History   Social History  . Marital Status: Single    Spouse Name: N/A    Number of Children: N/A  . Years of Education: N/A   Occupational History  . Not on file.   Social History Main Topics  . Smoking status: Former Smoker -- 0.25 packs/day for 5 years    Types: Cigarettes  . Smokeless tobacco: Never Used  . Alcohol Use: Yes     Comment: socially  . Drug Use: No  . Sexually Active: Yes    Birth Control/ Protection: None     Comment: pregnant [redacted] wks per patient   Other Topics Concern  . Not on file   Social History Narrative  . No narrative on file   No current facility-administered medications on file prior to encounter.   Current Outpatient Prescriptions on File Prior to Encounter  Medication Sig Dispense Refill  . ibuprofen (ADVIL,MOTRIN) 800 MG tablet Take 800 mg by mouth every 6 (six) hours as needed for pain.      Marland Kitchen levothyroxine (SYNTHROID, LEVOTHROID) 137 MCG tablet Take 137 mcg by mouth daily.      .  methylergonovine (METHERGINE) 0.2 MG tablet Take 1 tablet (0.2 mg total) by mouth every 6 (six) hours.  8 tablet  0  . oxyCODONE-acetaminophen (PERCOCET) 10-325 MG per tablet Take 1 tablet by mouth every 4 (four) hours as needed for pain.       No Known Allergies  ROS: Pertinent items in HPI  OBJECTIVE Blood pressure 69/34, pulse 81, temperature 97.3 F (36.3 C), temperature source Oral, resp. rate 18, last menstrual period 01/20/2012, SpO2 100.00%, unknown if currently breastfeeding. Patient Vitals for the past 24 hrs:  BP Temp Temp src Pulse Resp SpO2  04/02/12 1709 83/51 mmHg - - 94 - -  04/02/12 1645 78/42 mmHg - - 108 - -  04/02/12 1630 81/48 mmHg - - 100 - -  04/02/12 1621 83/50 mmHg - - 114 - -  04/02/12 1615 73/46 mmHg - - 115 - -  04/02/12 1600 78/43 mmHg - - 96 - -  04/02/12 1545 78/45 mmHg - - 96 - -  04/02/12 1530 89/48 mmHg - - 84 18 -  04/02/12 1515 80/44 mmHg - - 83 18 -  04/02/12 1505 82/46 mmHg - - 81 - -  04/02/12 1454 69/34 mmHg - - 81 18 100 %  04/02/12 1449 61/32 mmHg - - 85 20 100 %  04/02/12 1436 79/38 mmHg - - 96 16 -  04/02/12 1434 68/35 mmHg 97.3 F (36.3 C) Oral 96 18 100 %   GENERAL: Well-developed, well-nourished female in significant distress, diaphoretic with mild pallor.  HEENT: Normocephalic HEART: normal rate RESP: normal effort ABDOMEN: Soft, diffusely tender throughout lower abdomen EXTREMITIES: Nontender, no edema NEURO: Alert and oriented SPECULUM EXAM: Deferred until improved pain management, pt stabilized  LAB RESULTS Results for orders placed during the hospital encounter of 04/02/12 (from the past 24 hour(s))  CBC     Status: None   Collection Time    04/02/12  2:43 PM      Result Value Range   WBC 7.3  4.0 - 10.5 K/uL   RBC 4.13  3.87 - 5.11 MIL/uL   Hemoglobin 12.6  12.0 - 15.0 g/dL   HCT 32.4  40.1 - 02.7 %   MCV 89.8  78.0 - 100.0 fL   MCH 30.5  26.0 - 34.0 pg   MCHC 34.0  30.0 - 36.0 g/dL   RDW 25.3  66.4 - 40.3 %    Platelets 329  150 - 400 K/uL  PROTIME-INR     Status: Abnormal   Collection Time    04/02/12  4:42 PM      Result Value Range   Prothrombin Time 16.3 (*) 11.6 - 15.2 seconds   INR 1.34  0.00 - 1.49  APTT     Status: None   Collection Time    04/02/12  4:42 PM      Result Value Range   aPTT 27  24 - 37 seconds  TYPE AND SCREEN     Status: None   Collection Time    04/02/12  4:42 PM      Result Value Range   ABO/RH(D) PENDING     Antibody Screen PENDING     Sample Expiration 04/05/2012     Unit Number [id number]     Blood Component Type RBC LR PHER2     Unit division 00     Status of Unit ALLOCATED     Unit tag comment VERBAL ORDERS PER DR DR Malen Gauze     Transfusion Status OK TO TRANSFUSE     Crossmatch Result PENDING     Unit Number [id number]     Blood Component Type RBC LR PHER2     Unit division 00     Status of Unit ALLOCATED     Unit tag comment VERBAL ORDERS PER DR DR Malen Gauze     Transfusion Status OK TO TRANSFUSE     Crossmatch Result PENDING    HCG, QUANTITATIVE, PREGNANCY     Status: Abnormal   Collection Time    04/02/12  4:42 PM      Result Value Range   hCG, Beta Chain, Quant, S 1509 (*) <5 mIU/mL    IMAGING US Transvaginal Non-ob  04/02/2012  *RADIOLOGY REPORT*  Clinical Data: Hypotensive, pelvic pain.  D&C 03/17/2012  TRANSABDOMINAL AND TRANSVAGINAL ULTRASOUND OF PELVIS Technique:  Both transabdominal and transvaginal ultrasound examinations of the pelvis were performed. Transabdominal technique was performed for global imaging of the pelvis including uterus, ovaries, adnexal regions, and pelvic cul-de-sac.  It was necessary to proceed with endovaginal exam following the transabdominal exam to visualize the ovaries.  Comparison:  None  Findings:  Uterus: Anteverted, anteflexed.  No focal abnormality.  7.2 x 3.4 x 3.2 cm.  Endometrium: 5 mm.  Uniformly echogenic and thin.  Right ovary:  2.6 x  1.7 x 1.4 cm.  Normal.  Left ovary: Not visualized.  Other  findings: There is a large amount of complex fluid/probable clot within the pelvis, surrounding the uterus and extending to the adnexa.  IMPRESSION: Large amount of pelvic fluid and probable clot, compatible with hemoperitoneum.  This could be the result of uterine perforation given recent instrumentation.  The left ovary is not visualized, and theoretically there could have been interval ovarian cyst rupture which could also cause hemoperitoneum.  There is no reported history of trauma to otherwise account for this finding.  Critical Value/emergent results were called by telephone at the time of interpretation on 04/02/2012 at 4:30 p.m. to Collene Gobble- Craige Cotta, who verbally acknowledged these results.   Original Report Authenticated By: Christiana Pellant, M.D.    US Pelvis Complete  04/02/2012  *RADIOLOGY REPORT*  Clinical Data: Hypotensive, pelvic pain.  D&C 03/17/2012  TRANSABDOMINAL AND TRANSVAGINAL ULTRASOUND OF PELVIS Technique:  Both transabdominal and transvaginal ultrasound examinations of the pelvis were performed. Transabdominal technique was performed for global imaging of the pelvis including uterus, ovaries, adnexal regions, and pelvic cul-de-sac.  It was necessary to proceed with endovaginal exam following the transabdominal exam to visualize the ovaries.  Comparison:  None  Findings:  Uterus: Anteverted, anteflexed.  No focal abnormality.  7.2 x 3.4 x 3.2 cm.  Endometrium: 5 mm.  Uniformly echogenic and thin.  Right ovary:  2.6 x 1.7 x 1.4 cm.  Normal.  Left ovary: Not visualized.  Other findings: There is a large amount of complex fluid/probable clot within the pelvis, surrounding the uterus and extending to the adnexa.  IMPRESSION: Large amount of pelvic fluid and probable clot, compatible with hemoperitoneum.  This could be the result of uterine perforation given recent instrumentation.  The left ovary is not visualized, and theoretically there could have been interval ovarian cyst rupture which  could also cause hemoperitoneum.  There is no reported history of trauma to otherwise account for this finding.  Critical Value/emergent results were called by telephone at the time of interpretation on 04/02/2012 at 4:30 p.m. to Collene Gobble- Craige Cotta, who verbally acknowledged these results.   Original Report Authenticated By: Christiana Pellant, M.D.     ASSESSMENT Acute abdominal pain Hemoperitoneum   PLAN IV access x2, NS bolus 2 liters in MAU, CBC Called Dr Tamela Oddi to report assessment, findings, and radiology preliminary report Dr Tamela Oddi in to see pt, admit to OR Coagulation labs and type and screen ordered    Medication List    ASK your doctor about these medications       ibuprofen 800 MG tablet  Commonly known as:  ADVIL,MOTRIN  Take 800 mg by mouth every 6 (six) hours as needed for pain.     levothyroxine 137 MCG tablet  Commonly known as:  SYNTHROID, LEVOTHROID  Take 137 mcg by mouth daily.     methylergonovine 0.2 MG tablet  Commonly known as:  METHERGINE  Take 1 tablet (0.2 mg total) by mouth every 6 (six) hours.     oxyCODONE-acetaminophen 10-325 MG per tablet  Commonly known as:  PERCOCET  Take 1 tablet by mouth every 4 (four) hours as needed for pain.         Sharen Counter Certified Nurse-Midwife 04/02/2012  2:55 PM

## 2012-04-03 ENCOUNTER — Encounter (HOSPITAL_COMMUNITY): Payer: Self-pay | Admitting: *Deleted

## 2012-04-03 DIAGNOSIS — D62 Acute posthemorrhagic anemia: Secondary | ICD-10-CM | POA: Diagnosis present

## 2012-04-03 DIAGNOSIS — K661 Hemoperitoneum: Secondary | ICD-10-CM | POA: Diagnosis present

## 2012-04-03 LAB — CBC
MCH: 29.7 pg (ref 26.0–34.0)
MCHC: 34.2 g/dL (ref 30.0–36.0)
MCV: 86.7 fL (ref 78.0–100.0)
Platelets: 152 10*3/uL (ref 150–400)
RBC: 2.63 MIL/uL — ABNORMAL LOW (ref 3.87–5.11)
RDW: 14.9 % (ref 11.5–15.5)

## 2012-04-03 LAB — COMPREHENSIVE METABOLIC PANEL
AST: 13 U/L (ref 0–37)
CO2: 24 mEq/L (ref 19–32)
Calcium: 6.8 mg/dL — ABNORMAL LOW (ref 8.4–10.5)
Creatinine, Ser: 0.54 mg/dL (ref 0.50–1.10)
GFR calc non Af Amer: >90 mL/min (ref >90)
Total Protein: 4.4 g/dL — ABNORMAL LOW (ref 6.0–8.3)

## 2012-04-03 LAB — HEMOGLOBIN AND HEMATOCRIT, BLOOD
HCT: 23.5 % — ABNORMAL LOW (ref 36.0–46.0)
Hemoglobin: 8.3 g/dL — ABNORMAL LOW (ref 12.0–15.0)

## 2012-04-03 LAB — PROTIME-INR: Prothrombin Time: 15.7 seconds — ABNORMAL HIGH (ref 11.6–15.2)

## 2012-04-03 MED ORDER — DIPHENHYDRAMINE HCL 25 MG PO CAPS
25.0000 mg | ORAL_CAPSULE | ORAL | Status: DC | PRN
  Administered 2012-04-03 – 2012-04-04 (×2): 25 mg via ORAL
  Filled 2012-04-03 (×2): qty 1

## 2012-04-03 MED ORDER — ZOLPIDEM TARTRATE 5 MG PO TABS: 5.0000 mg | ORAL_TABLET | Freq: Every evening | ORAL | Status: DC | PRN

## 2012-04-03 MED ORDER — MENTHOL 3 MG MT LOZG
1.0000 | LOZENGE | OROMUCOSAL | Status: DC | PRN
  Filled 2012-04-03: qty 9

## 2012-04-03 MED ORDER — PANTOPRAZOLE SODIUM 40 MG IV SOLR: 40.0000 mg | Freq: Every day | INTRAVENOUS | Status: DC

## 2012-04-03 MED ORDER — SODIUM CHLORIDE 0.9 % IV SOLN: Freq: Once | INTRAVENOUS | Status: DC

## 2012-04-03 MED ORDER — SODIUM CHLORIDE 0.9 % IJ SOLN: 9.0000 mL | INTRAMUSCULAR | Status: DC | PRN

## 2012-04-03 MED ORDER — SODIUM CHLORIDE 0.9 % IV SOLN
INTRAVENOUS | Status: DC
  Administered 2012-04-03 (×2): via INTRAVENOUS

## 2012-04-03 MED ORDER — DIPHENHYDRAMINE HCL 50 MG/ML IJ SOLN: 12.5000 mg | Freq: Four times a day (QID) | INTRAMUSCULAR | Status: DC | PRN

## 2012-04-03 MED ORDER — LEVOTHYROXINE SODIUM 137 MCG PO TABS
137.0000 ug | ORAL_TABLET | Freq: Every day | ORAL | Status: DC
  Administered 2012-04-03 – 2012-04-05 (×3): 137 ug via ORAL
  Filled 2012-04-03 (×4): qty 1

## 2012-04-03 MED ORDER — ONDANSETRON HCL 4 MG PO TABS: 4.0000 mg | ORAL_TABLET | Freq: Four times a day (QID) | ORAL | Status: DC | PRN

## 2012-04-03 MED ORDER — POTASSIUM CHLORIDE IN NACL 20-0.45 MEQ/L-% IV SOLN
INTRAVENOUS | Status: DC
  Filled 2012-04-03 (×2): qty 1000

## 2012-04-03 MED ORDER — DIPHENHYDRAMINE HCL 12.5 MG/5ML PO ELIX
12.5000 mg | ORAL_SOLUTION | Freq: Four times a day (QID) | ORAL | Status: DC | PRN
  Filled 2012-04-03: qty 5

## 2012-04-03 MED ORDER — ONDANSETRON HCL 4 MG/2ML IJ SOLN: 4.0000 mg | Freq: Four times a day (QID) | INTRAMUSCULAR | Status: DC | PRN

## 2012-04-03 MED ORDER — HYDROMORPHONE HCL PF 1 MG/ML IJ SOLN
0.2000 mg | INTRAMUSCULAR | Status: DC | PRN
  Administered 2012-04-03: 0.4 mg via INTRAVENOUS
  Filled 2012-04-03: qty 1

## 2012-04-03 MED ORDER — SIMETHICONE 80 MG PO CHEW: 80.0000 mg | CHEWABLE_TABLET | Freq: Four times a day (QID) | ORAL | Status: DC | PRN

## 2012-04-03 MED ORDER — OXYCODONE-ACETAMINOPHEN 5-325 MG PO TABS
1.0000 | ORAL_TABLET | ORAL | Status: DC | PRN
  Administered 2012-04-03 – 2012-04-05 (×10): 2 via ORAL
  Administered 2012-04-05: 1 via ORAL
  Filled 2012-04-03 (×5): qty 2
  Filled 2012-04-03: qty 1
  Filled 2012-04-03 (×5): qty 2

## 2012-04-03 MED ORDER — NALOXONE HCL 0.4 MG/ML IJ SOLN: 0.4000 mg | INTRAMUSCULAR | Status: DC | PRN

## 2012-04-03 MED ORDER — PANTOPRAZOLE SODIUM 40 MG PO TBEC
40.0000 mg | DELAYED_RELEASE_TABLET | Freq: Every day | ORAL | Status: DC
  Administered 2012-04-03 – 2012-04-04 (×2): 40 mg via ORAL
  Filled 2012-04-03 (×3): qty 1

## 2012-04-03 NOTE — Progress Notes (Signed)
The patient is receiving protonix by the intravenous route.  Based on criteria approved by the Pharmacy and Therapeutics Committee and the Medical Executive Committee, the medication is being converted to the equivalent oral dose form.  These criteria include: -No Active GI bleeding -Able to tolerate diet of full liquids (or better) or tube feeding -Able to tolerate other medications by the oral or enteral route  If you have any questions about this conversion, please contact the Pharmacy Department (ext [phone]).  Thank you.  Natasha Bence, Pharm.D. 04/03/2012

## 2012-04-03 NOTE — Anesthesia Postprocedure Evaluation (Signed)
  Anesthesia Post-op Note  Patient: Kiara Juarez  Procedure(s) Performed: Procedure(s) with comments: EXPLORATORY LAPAROTOMY (N/A) UNILATERAL SALPINGECTOMY (Left) - partial salpingectomy  Patient Location: A-ICU  Anesthesia Type:General  Level of Consciousness: awake, alert  and oriented  Airway and Oxygen Therapy: Patient Spontanous Breathing  Post-op Pain: mild  Post-op Assessment: Patient's Cardiovascular Status Stable, Respiratory Function Stable, Patent Airway, No signs of Nausea or vomiting and Pain level controlled  Post-op Vital Signs: stable  Complications: No apparent anesthesia complications

## 2012-04-03 NOTE — Progress Notes (Signed)
1 Day Post-Op Procedure(s) (LRB): EXPLORATORY LAPAROTOMY (N/A) UNILATERAL SALPINGECTOMY (Left)  Subjective: Patient reports incisional pain.    Objective: Vital signs in last 24 hours: Temp:  [97 F (36.1 C)-99.2 F (37.3 C)] 99.2 F (37.3 C) (03/15 1200) Pulse Rate:  [60-115] 104 (03/15 1200) Resp:  [9-20] 16 (03/15 1200) BP: (61-113)/(32-63) 113/62 mmHg (03/15 1200) SpO2:  [99 %-100 %] 100 % (03/15 1200) Weight:  [163 lb 3 oz (74.021 kg)] 163 lb 3 oz (74.021 kg) (03/14 2244) Last BM Date: 04/01/12  Intake/Output from previous day: 03/14 0701 - 03/15 0700 In: 5444.2 [P.O.:260; I.V.:4629.2; Blood:555] Out: 3350 [Urine:1350; Blood:2000] Intake/Output this shift: Total I/O In: 745 [P.O.:120; I.V.:625] Out: 275 [Urine:275]  Physical Examination:  General: alert GI: soft, non-tender; bowel sounds normal; no masses,  no organomegaly and incision: Dressing clean and dry Extremities: extremities normal, atraumatic, no cyanosis or edema   Labs:  WBC/Hgb/Hct/Plts:  7.6/7.8/22.8/152 (03/15 0805) BUN/Cr/glu/ALT/AST/amyl/lip:  7/0.54/--/9/13/--/-- (03/15 0805)  Assessment:  27 y.o. s/p Procedure(s): EXPLORATORY LAPAROTOMY UNILATERAL SALPINGECTOMY: stable  Pain: Pain is well-controlled on PCA or oral medications.  Heme: Anemia: Consistent with blood loss. Hemodynamically stable  GI:  Tolerating po: Yes    Prophylaxis: intermittent pneumatic compression boots.  Plan: Advance diet Encourage ambulation Likely transfer from the unit today  LOS: 1 day     JACKSON-MOORE,Stella Bortle A 04/03/2012, 1:34 PM

## 2012-04-03 NOTE — Plan of Care (Signed)
Problem: Phase I Progression Outcomes Goal: Pain controlled with appropriate interventions Outcome: Completed/Met Date Met:  04/03/12 Good control with Dilaudid PCA,but had itching and was placed on po Percocet Goal: Dangle/OOB as tolerated per MD order Outcome: Completed/Met Date Met:  04/03/12 Walked to door and back and tolerated well Goal: VS, stable, temp < 100.4 degrees F Outcome: Completed/Met Date Met:  04/03/12 Bp's run a little low MD aware and CBC was drawn and MD aware of results.Patient given fluid challenge    Goal: IS, TCDB as ordered Outcome: Completed/Met Date Met:  04/03/12 I/S up to 2250 Goal: Other Phase I Outcomes/Goals Outcome: Not Progressing D/C Foley

## 2012-04-04 NOTE — Plan of Care (Signed)
Problem: Phase III Progression Outcomes Goal: Remove staples if indicated/incision care Outcome: Not Applicable Date Met:  04/04/12 Patient has steri strips and sutures  Problem: Discharge Progression Outcomes Goal: Barriers To Progression Addressed/Resolved Outcome: Completed/Met Date Met:  04/04/12 Tolerating ambulation a lot better without getting dizzy Goal: Discharge plan in place and appropriate Outcome: Completed/Met Date Met:  04/04/12 VSS.Afebrile Pain controlled Understands self care and what to call MD for Understands F/U care Goal: Pain controlled with appropriate interventions Outcome: Completed/Met Date Met:  04/04/12 Good pain control with po Percocet Goal: Tolerating diet Outcome: Completed/Met Date Met:  04/04/12 Tolerating regular diet well

## 2012-04-04 NOTE — Progress Notes (Signed)
Patient ID: Kiara Juarez, female   DOB: [birth year], 27 y.o.   MRN: [id number] 2 Days Post-Op Procedure(s) (LRB): EXPLORATORY LAPAROTOMY (N/A) UNILATERAL SALPINGECTOMY (Left)  Subjective: Patient reports incisional pain.    Objective: Vital signs in last 24 hours: Temp:  [98.1 F (36.7 C)-98.4 F (36.9 C)] 98.3 F (36.8 C) (03/16 1000) Pulse Rate:  [84-102] 86 (03/16 1000) Resp:  [16-18] 18 (03/16 1000) BP: (92-117)/(44-70) 108/70 mmHg (03/16 1000) SpO2:  [97 %-100 %] 97 % (03/16 1000) Last BM Date: 04/01/12  Intake/Output from previous day: 03/15 0701 - 03/16 0700 In: 1955 [P.O.:1080; I.V.:875] Out: 2450 [Urine:2450] Intake/Output this shift:    Physical Examination:  General: alert GI: soft, non-tender; bowel sounds normal; no masses,  no organomegaly and incision: C/D/I Extremities: extremities normal, atraumatic, no cyanosis or edema    Assessment:  27 y.o. s/p Procedure(s): EXPLORATORY LAPAROTOMY UNILATERAL SALPINGECTOMY: stable  Pain: Pain is well-controlled  oral medications.  Heme: Anemia: Consistent with blood loss. Hemodynamically stable  GI:  Tolerating po: Yes    Prophylaxis: intermittent pneumatic compression boots.  Plan:  Encourage ambulation Anticipated discharge tomorrow  LOS: 2 days     JACKSON-MOORE,Daisean Brodhead A 04/04/2012, 1:23 PM

## 2012-04-05 ENCOUNTER — Encounter (HOSPITAL_COMMUNITY): Payer: Self-pay | Admitting: Obstetrics & Gynecology

## 2012-04-05 MED ORDER — OXYCODONE-ACETAMINOPHEN 5-325 MG PO TABS: 1.0000 | ORAL_TABLET | Freq: Four times a day (QID) | ORAL | Status: DC | PRN

## 2012-04-05 MED ORDER — DOCUSATE SODIUM 100 MG PO CAPS: 100.0000 mg | ORAL_CAPSULE | Freq: Two times a day (BID) | ORAL | Status: DC | PRN

## 2012-04-05 MED ORDER — FUSION PLUS PO CAPS: 1.0000 | ORAL_CAPSULE | Freq: Every morning | ORAL | Status: DC

## 2012-04-05 MED ORDER — BISACODYL 10 MG RE SUPP
10.0000 mg | Freq: Once | RECTAL | Status: AC
  Administered 2012-04-05: 10 mg via RECTAL
  Filled 2012-04-05: qty 1

## 2012-04-05 NOTE — Progress Notes (Signed)
Patient ID: Kiara Juarez, female   DOB: 1985/04/09, 27 y.o.   MRN: [id number] 3 Days Post-Op Procedure(s) (LRB): EXPLORATORY LAPAROTOMY (N/A) UNILATERAL SALPINGECTOMY (Left)  Subjective: Patient reports gas pain/no flatus or BM   Objective: Vital signs in last 24 hours: Temp:  [97.4 F (36.3 C)-98.8 F (37.1 C)] 98.6 F (37 C) (03/17 4696) Pulse Rate:  [80-100] 80 (03/17 0611) Resp:  [16-18] 16 (03/17 0611) BP: (98-116)/(52-72) 99/56 mmHg (03/17 0611) SpO2:  [96 %-100 %] 98 % (03/17 0611) Last BM Date: 04/01/12      Physical Examination:  General: alert GI: soft, non-tender; bowel sounds normal; no masses,  no organomegaly and incision: C/D/I Extremities: extremities normal, atraumatic, no cyanosis or edema    Assessment:  27 y.o. s/p Procedure(s): EXPLORATORY LAPAROTOMY UNILATERAL SALPINGECTOMY: stable  Pain: Pain is well-controlled  oral medications.  Heme: Anemia: Consistent with blood loss. Hemodynamically stable  GI:  Tolerating po: Yes    Prophylaxis: intermittent pneumatic compression boots.  Plan: K pad Encourage ambulation Dulcolax supp Anticipate d/c later today  LOS: 3 days     JACKSON-MOORE,Kota Ciancio A 04/05/2012, 8:52 AM

## 2012-04-05 NOTE — Progress Notes (Signed)
Ur chart review completed.  

## 2012-04-05 NOTE — Progress Notes (Deleted)
Pain meds

## 2012-04-05 NOTE — Progress Notes (Signed)
Patient was discharged to home with significant other. Pt is ambulatory and feels much better after having passed gas and stool. Patient denies pain. Pt's incision is clean and approximated with no signs of infection.

## 2012-04-06 LAB — TYPE AND SCREEN
Antibody Screen: NEGATIVE
Unit division: 0
Unit division: 0

## 2012-04-08 NOTE — Discharge Summary (Signed)
Physician Discharge Summary  Patient ID: MAYZEE REICHENBACH MRN: [id number] DOB/AGE: 05/31/1985 27 y.o.  Admit date: 04/02/2012 Discharge date: 04/08/2012  Admission Diagnoses: Active Problems:   Abdominal pain, acute   Hemoperitoneum due to rupture of left tubal ectopic pregnancy   Acute blood loss anemia  Discharge Diagnoses:  Active Problems:   Abdominal pain, acute   Hemoperitoneum due to rupture of left tubal ectopic pregnancy   Acute blood loss anemia   Discharged Condition: stable  Hospital Course:  The patient presented with an acute abdomen and hypotension.  A pelvic U/S was consistent with a hemoperitoneum.  On 04/02/2012, the patient underwent the following: Procedure(s): EXPLORATORY LAPAROTOMY UNILATERAL SALPINGECTOMY.   Intraoperatively she was transfused 2 units PRBC.  She remained hemodynamically stable postoperatively.   She was discharged to home on postoperative day 3 tolerating a regular diet.  Consults: None  Significant Diagnostic Studies: radiology: Ultrasound: hemoperitoneum  Treatments: surgery: see above  Discharge Exam: Blood pressure 103/57, pulse 86, temperature 98.7 F (37.1 C), temperature source Oral, resp. rate 16, weight 163 lb 3 oz (74.021 kg), last menstrual period 01/20/2012, SpO2 94.00%, unknown if currently breastfeeding. General appearance: alert GI: soft, non-tender; bowel sounds normal; no masses,  no organomegaly Extremities: extremities normal, atraumatic, no cyanosis or edema Incision/Wound:  C/D/I  Disposition: 01-Home or Self Care  Discharge Orders   Future Appointments Provider Department Dept Phone   04/14/2012 11:15 AM Antionette Char, MD [facility] [phone]   Future Orders Complete By Expires     Call MD for:  extreme fatigue  As directed     Call MD for:  persistant dizziness or light-headedness  As directed     Call MD for:  persistant nausea and vomiting  As directed     Call MD for:  redness,  tenderness, or signs of infection (pain, swelling, redness, odor or green/yellow discharge around incision site)  As directed     Call MD for:  severe uncontrolled pain  As directed     Call MD for:  temperature >100.4  As directed     Diet general  As directed     Discharge wound care:  As directed     Comments:      Keep clean and dry    Driving Restrictions  As directed     Comments:      No driving for 1 week    Increase activity slowly  As directed     Lifting restrictions  As directed     Comments:      No lifting > 5 lbs for 6 weeks    May shower / Bathe  As directed     Comments:      No tub baths for 6 weeks    May walk up steps  As directed     Sexual Activity Restrictions  As directed     Comments:      No intercourse for 6  weeks        Medication List    STOP taking these medications       oxyCODONE-acetaminophen 10-325 MG per tablet  Commonly known as:  PERCOCET     traMADol 50 MG tablet  Commonly known as:  ULTRAM     TYLENOL SORE THROAT DAYTIME 167 MG/5ML Liqd  Generic drug:  Acetaminophen      TAKE these medications       docusate sodium 100 MG capsule  Commonly known as:  COLACE  Take 1 capsule (100 mg total) by mouth 2 (two) times daily as needed for constipation.     FUSION PLUS Caps  Take 1 capsule by mouth every morning.     ibuprofen 800 MG tablet  Commonly known as:  ADVIL,MOTRIN  Take 800 mg by mouth every 6 (six) hours as needed for pain.     levothyroxine 137 MCG tablet  Commonly known as:  SYNTHROID, LEVOTHROID  Take 137 mcg by mouth daily.     oxyCODONE-acetaminophen 5-325 MG per tablet  Commonly known as:  PERCOCET/ROXICET  Take 1-2 tablets by mouth every 6 (six) hours as needed.           Follow-up Information   Follow up with Roseanna Rainbow, MD. Schedule an appointment as soon as possible for a visit on 04/14/2012. (Appointment @ 11 am)    Contact information:   [address], [address] [place]  [address] [phone]       Signed: Roseanna Rainbow 04/08/2012, 1:39 AM

## 2012-04-14 ENCOUNTER — Ambulatory Visit (INDEPENDENT_AMBULATORY_CARE_PROVIDER_SITE_OTHER): Payer: BC Managed Care – PPO | Admitting: Obstetrics & Gynecology

## 2012-04-14 ENCOUNTER — Encounter: Payer: Self-pay | Admitting: Obstetrics & Gynecology

## 2012-04-14 ENCOUNTER — Encounter: Payer: Self-pay | Admitting: *Deleted

## 2012-04-14 VITALS — BP 107/72 | HR 99 | Temp 97.9°F | Ht 63.0 in | Wt 138.0 lb

## 2012-04-14 DIAGNOSIS — Z09 Encounter for follow-up examination after completed treatment for conditions other than malignant neoplasm: Secondary | ICD-10-CM

## 2012-04-14 NOTE — Progress Notes (Signed)
Subjective:     Kiara Juarez is a 27 y.o. female who presents to the clinic 2 weeks status post left partial salpingectomy for a ruptured tubal ectopic pregnancy. Eating a regular diet without difficulty. Bowel movements are normal. Pain is controlled with current analgesics. Medications being used: narcotic analgesics including Percocet.  The following portions of the patient's history were reviewed and updated as appropriate: past family history, past medical history, past surgical history and problem list.  Review of Systems Pertinent items are noted in HPI.    Objective:    BP 107/72  Pulse 99  Temp(Src) 97.9 F (36.6 C) (Oral)  Ht 5\' 3"  (1.6 m)  Wt 138 lb (62.596 kg)  BMI 24.45 kg/m2  LMP 01/20/2012 General:  alert  Abdomen: soft, bowel sounds active, non-tender  Incision:   no drainage, no erythema, no hernia, no seroma, no swelling, no dehiscence, incision well approximated     Assessment:    Doing well postoperatively. Operative findings again reviewed. Pathology report discussed.    Plan:    1. Continue any current medications. 2. Wound care discussed. 3. Activity restrictions: no lifting more than 10 pounds 4. Anticipated return to work: 4 weeks. 5. Follow up: 4 weeks

## 2012-04-19 ENCOUNTER — Encounter: Payer: Self-pay | Admitting: *Deleted

## 2012-05-12 ENCOUNTER — Ambulatory Visit (INDEPENDENT_AMBULATORY_CARE_PROVIDER_SITE_OTHER): Payer: BC Managed Care – PPO | Admitting: Obstetrics & Gynecology

## 2012-05-12 ENCOUNTER — Encounter: Payer: Self-pay | Admitting: Obstetrics & Gynecology

## 2012-05-12 VITALS — BP 125/85 | HR 88 | Temp 98.2°F | Wt 143.0 lb

## 2012-05-12 DIAGNOSIS — D500 Iron deficiency anemia secondary to blood loss (chronic): Secondary | ICD-10-CM

## 2012-05-12 DIAGNOSIS — Z9889 Other specified postprocedural states: Secondary | ICD-10-CM

## 2012-05-12 LAB — HEMOGLOBIN AND HEMATOCRIT, BLOOD: HCT: 36.5 % (ref 36.0–46.0)

## 2012-05-12 NOTE — Progress Notes (Deleted)
@  RRDAYSPOSTSURGERY@ @RRSURGERY @  Subjective: Patient reports {sub:3041132}.    Objective: Vital signs in last 24 hours: @VSRANGES @    Intake/Output from previous day:   Intake/Output this shift: @IOTHISSHIFT @  Physical Examination:  {Physical ZOXW:9604540}   Labs:  @RRCBC @ @RRCHEM @  Assessment:  27 y.o. s/p : {assessment details:3041134}  Pain: Pain {Is/is not:9024} well-controlled on PCA or oral medications.  Heme:{System heme / coag:30811}  ID: {INFECTIOUS DISEASES:3041306}  CV:  {System cardiovascular:30809} {CHL DESC; HYPERTENSION CONTROL:21525}. Current treatment:  {HYPERTENSION [id number].  GI:  Tolerating po: {yes no:315493::"Yes"}  The pt's N/V is controlled with : {meds; nausea:60360}.  Endo: ***{Diagnoses; diabetes:14078}.  CBG: {Findings; diabetes glucometry results:16657}.  Prophylaxis: {plan; dvt prophylaxis:16689}.  Plan: {JWJX:9147829} Consults: @CM @, Social Work @RRHLOS @    Kiara Juarez 05/12/2012, 2:59 PM

## 2012-05-12 NOTE — Patient Instructions (Addendum)
Preparing for Pregnancy  Preparing for pregnancy (preconceptual care) by getting counseling and information from your caregiver before getting pregnant is a good idea. It will help you and your baby have a better chance to have a healthy, safe pregnancy and delivery of your baby. Make an appointment with your caregiver to talk about your health, medical, and family history and how to prepare yourself before getting pregnant. Your caregiver will do a complete physical exam and a Pap test. They will want to know:  · About you, your spouse or partner, and your family's medical and genetic history.  · If you are eating a balanced diet and drinking enough fluids.  · What vitamins and mineral supplements you are taking. This includes taking folic acid before getting pregnant to help prevent birth defects.  · What medications you are taking including prescription, over-the-counter and herbal medications.  · If there is any substance abuse like alcohol, smoking, and illegal drugs.  · If there is any mental or physical domestic violence.  · If there is any risk of sexually transmitted disease between you and your partner.  · What immunizations and vaccinations you have had and what you may need before getting pregnant.  · If you should get tested for HIV infection.  · If there is any exposure to chemical or toxic substances at home or work.  · If there are medical problems you have that need to be treated and kept under control before getting pregnant such as diabetes, high blood pressure or others.  · If there were any past surgeries, pregnancies and problems with them.  · What your current weight is and to set a goal as to how much weight you should gain while pregnant. Also, they will check if you should lose or gain weight before getting pregnant.  · What is your exercise routine and what it is safe when you are pregnant.  · If there are any physical disabilities that need to be addressed.  · About spacing your  pregnancies when there are other children.  · If there is a financial problem that may affect you having a child.  After talking about the above points with your caregiver, your caregiver will give you advice on how to help treat and work with you on solving any issues, if necessary, before getting pregnant. The goal is to have a healthy and safe pregnancy for you and your baby. You should keep an accurate record of your menstrual periods because it will help in determining your due date.  Immunizations that you should have before getting pregnant:   · Regular measles, German measles (rubella) and mumps.  · Tetanus and diphtheria.  · Chickenpox, if not immune.  · Herpes zoster (Varicella) if not immune.  · Human papilloma virus vaccine (HPV) between the age of 9 and 26 years old.  · Hepatitis A vaccine.  · Hepatitis B vaccine.  · Influenza vaccine.  · Pneumococcal vaccine (pneumonia).  You should avoid getting pregnant for one month after getting vaccinated with a live virus vaccine such as German measles (rubella) vaccine. Other immunizations may be necessary depending on where you live, such as malaria. Ask your caregiver if any other immunizations are needed for you.  HOME CARE INSTRUCTIONS   · Follow the advice of your caregiver.  · Before getting pregnant:  · Begin taking vitamins, supplements, and 0.4 milligrams folic acid daily.  · Get your immunizations up-to-date.  · Get help from a nutrition counselor   if you do not understand what a balanced diet is, need help with a special medical diet or if you need help to lose or gain weight.  · Begin exercising.  · Stop smoking, taking illegal drugs, and drinking alcoholic beverages.  · Get counseling if there is and type of domestic violence.  · Get checked for sexually transmitted diseases including HIV.  · Get any medical problems under control (diabetes, high blood pressure, convulsions, asthma or others).  · Resolve any financial concerns.  · Be sure you and  your spouse or partner are ready to have a baby.  · Keep an accurate record of your menstrual periods.  Document Released: 12/20/2007 Document Revised: 03/31/2011 Document Reviewed: 12/20/2007  ExitCare® Patient Information ©2013 [employer].

## 2012-05-12 NOTE — Progress Notes (Signed)
Subjective:     Kiara Juarez is a 27 y.o. female who presents to the clinic 6 weeks status post Left salpingectomy for rupture of ectopic. Eating a regular diet without difficulty. Bowel movements are normal. The patient is not having any pain.  The following portions of the patient's history were reviewed and updated as appropriate: allergies, current medications, past family history, past medical history, past social history, past surgical history and problem list.  Review of Systems Pertinent items are noted in HPI.    Objective:    BP 125/85  Pulse 88  Temp(Src) 98.2 F (36.8 C)  Wt 143 lb (64.864 kg)  BMI 25.34 kg/m2  LMP 05/05/2012 General:  alert  Abdomen: soft, bowel sounds active, non-tender  Incision:   healing well, no drainage, no erythema, no hernia, no seroma, no swelling, no dehiscence, incision well approximated    Pelvic: Spec: no discharge; uterus NT; adnexa NT Assessment:    Doing well postoperatively.   Plan:   Preconception counseling given Return in 6 weeks

## 2012-05-13 ENCOUNTER — Encounter: Payer: Self-pay | Admitting: Obstetrics & Gynecology

## 2012-05-13 DIAGNOSIS — Z9889 Other specified postprocedural states: Secondary | ICD-10-CM | POA: Insufficient documentation

## 2012-06-23 ENCOUNTER — Encounter: Payer: BC Managed Care – PPO | Admitting: Obstetrics & Gynecology

## 2012-06-28 ENCOUNTER — Encounter: Payer: Self-pay | Admitting: Obstetrics & Gynecology

## 2012-06-28 ENCOUNTER — Ambulatory Visit (INDEPENDENT_AMBULATORY_CARE_PROVIDER_SITE_OTHER): Payer: BC Managed Care – PPO | Admitting: Obstetrics & Gynecology

## 2012-06-28 VITALS — BP 112/73 | HR 101 | Temp 98.6°F

## 2012-06-28 DIAGNOSIS — Z09 Encounter for follow-up examination after completed treatment for conditions other than malignant neoplasm: Secondary | ICD-10-CM

## 2012-06-28 NOTE — Progress Notes (Signed)
.   Subjective:     Kiara Juarez is a 27 y.o. female who presents to the clinic 3 weeks status post rupture of ectopic pregnancy for  Eating a regular diet without difficulty. Bowel movements are normal. Pain- present - burning type pain inside- patient is presently under treatment for UTI.  The following portions of the patient's history were reviewed and updated as appropriate: allergies, current medications, past family history, past medical history, past social history, past surgical history and problem list.  Review of Systems Pertinent items are noted in HPI.    Objective:    BP 112/73  Pulse 101  Temp(Src) 98.6 F (37 C)  LMP 06/01/2012 General:  alert  Abdomen: soft, bowel sounds active, non-tender  Incision:   healing well, no drainage, no erythema, no hernia, no seroma, no swelling, incision well approximated     Assessment:    ?Wound remodeling, trigger point   Plan:   Consider a neuromodulator Return in 1 mth

## 2012-06-28 NOTE — Progress Notes (Deleted)
Patient ID: Kiara Juarez, female   DOB: Feb 03, 1985, 27 y.o.   MRN: [id number]  Patient is post op surgery for ectopic rupture. Patient states she has some numbness in the incision. Patient states she has some abdominal discomfort.

## 2012-06-28 NOTE — Progress Notes (Deleted)
@  RRDAYSPOSTSURGERY@ @RRSURGERY @  Subjective: Patient reports {sub:3041132}.    Objective: Vital signs in last 24 hours: @VSRANGES @    Intake/Output from previous day:   Intake/Output this shift: @IOTHISSHIFT @  Physical Examination:  {Physical ZOXW:9604540}   Labs:  @RRCBC @ @RRCHEM @  Assessment:  27 y.o. s/p : {assessment details:3041134}  Pain: Pain {Is/is not:9024} well-controlled on PCA or oral medications.  Heme:{System heme / coag:30811}  ID: {INFECTIOUS DISEASES:3041306}  CV:  {System cardiovascular:30809} {CHL DESC; HYPERTENSION CONTROL:21525}. Current treatment:  {HYPERTENSION [id number].  GI:  Tolerating po: {yes no:315493::"Yes"}  The pt's N/V is controlled with : {meds; nausea:60360}.  Endo: ***{Diagnoses; diabetes:14078}.  CBG: {Findings; diabetes glucometry results:16657}.  Prophylaxis: {plan; dvt prophylaxis:16689}.  Plan: {JWJX:9147829} Consults: @CM @, Social Work @RRHLOS @    Elby Beck Flinchum 06/28/2012, 4:43 PM

## 2012-06-30 ENCOUNTER — Encounter: Payer: Self-pay | Admitting: Obstetrics & Gynecology

## 2012-07-28 ENCOUNTER — Encounter: Payer: Self-pay | Admitting: Obstetrics & Gynecology

## 2012-11-25 ENCOUNTER — Other Ambulatory Visit: Payer: Self-pay

## 2013-02-17 ENCOUNTER — Other Ambulatory Visit: Payer: Self-pay | Admitting: *Deleted

## 2013-02-17 ENCOUNTER — Encounter: Payer: Self-pay | Admitting: Obstetrics & Gynecology

## 2013-02-17 ENCOUNTER — Ambulatory Visit (INDEPENDENT_AMBULATORY_CARE_PROVIDER_SITE_OTHER): Payer: BC Managed Care – PPO | Admitting: Obstetrics & Gynecology

## 2013-02-17 VITALS — BP 116/75 | Temp 98.8°F | Wt 148.0 lb

## 2013-02-17 DIAGNOSIS — Z113 Encounter for screening for infections with a predominantly sexual mode of transmission: Secondary | ICD-10-CM

## 2013-02-17 DIAGNOSIS — Z3400 Encounter for supervision of normal first pregnancy, unspecified trimester: Secondary | ICD-10-CM

## 2013-02-17 DIAGNOSIS — Z8759 Personal history of other complications of pregnancy, childbirth and the puerperium: Secondary | ICD-10-CM

## 2013-02-17 DIAGNOSIS — Z3201 Encounter for pregnancy test, result positive: Secondary | ICD-10-CM

## 2013-02-17 LAB — POCT URINALYSIS DIPSTICK
Bilirubin, UA: NEGATIVE
GLUCOSE UA: NEGATIVE
KETONES UA: NEGATIVE
Leukocytes, UA: NEGATIVE
Nitrite, UA: NEGATIVE
Protein, UA: NEGATIVE
RBC UA: NEGATIVE
SPEC GRAV UA: 1.015
UROBILINOGEN UA: NEGATIVE
pH, UA: 8

## 2013-02-17 LAB — POCT URINE PREGNANCY: Preg Test, Ur: POSITIVE

## 2013-02-17 NOTE — Patient Instructions (Signed)
Pregnancy - First Trimester  During sexual intercourse, millions of sperm go into the vagina. Only 1 sperm will penetrate and fertilize the female egg while it is in the Fallopian tube. One week later, the fertilized egg implants into the wall of the uterus. An embryo begins to develop into a baby. At 6 to 8 weeks, the eyes and face are formed and the heartbeat can be seen on ultrasound. At the end of 12 weeks (first trimester), all the baby's organs are formed. Now that you are pregnant, you will want to do everything you can to have a healthy baby. Two of the most important things are to get good prenatal care and follow your caregiver's instructions. Prenatal care is all the medical care you receive before the baby's birth. It is given to prevent, find, and treat problems during the pregnancy and childbirth.  PRENATAL EXAMS  · During prenatal visits, your weight, blood pressure, and urine are checked. This is done to make sure you are healthy and progressing normally during the pregnancy.  · A pregnant woman should gain 25 to 35 pounds during the pregnancy. However, if you are overweight or underweight, your caregiver will advise you regarding your weight.  · Your caregiver will ask and answer questions for you.  · Blood work, cervical cultures, other necessary tests, and a Pap test are done during your prenatal exams. These tests are done to check on your health and the probable health of your baby. Tests are strongly recommended and done for HIV with your permission. This is the virus that causes AIDS. These tests are done because medicines can be given to help prevent your baby from being born with this infection should you have been infected without knowing it. Blood work is also used to find out your blood type, previous infections, and follow your blood levels (hemoglobin).  · Low hemoglobin (anemia) is common during pregnancy. Iron and vitamins are given to help prevent this. Later in the pregnancy, blood  tests for diabetes will be done along with any other tests if any problems develop.  · You may need other tests to make sure you and the baby are doing well.  CHANGES DURING THE FIRST TRIMESTER   Your body goes through many changes during pregnancy. They vary from person to person. Talk to your caregiver about changes you notice and are concerned about. Changes can include:  · Your menstrual period stops.  · The egg and sperm carry the genes that determine what you look like. Genes from you and your partner are forming a baby. The female genes determine whether the baby is a boy or a girl.  · Your body increases in girth and you may feel bloated.  · Feeling sick to your stomach (nauseous) and throwing up (vomiting). If the vomiting is uncontrollable, call your caregiver.  · Your breasts will begin to enlarge and become tender.  · Your nipples may stick out more and become darker.  · The need to urinate more. Painful urination may mean you have a bladder infection.  · Tiring easily.  · Loss of appetite.  · Cravings for certain kinds of food.  · At first, you may gain or lose a couple of pounds.  · You may have changes in your emotions from day to day (excited to be pregnant or concerned something may go wrong with the pregnancy and baby).  · You may have more vivid and strange dreams.  HOME CARE INSTRUCTIONS   ·   It is very important to avoid all smoking, alcohol and non-prescribed drugs during your pregnancy. These affect the formation and growth of the baby. Avoid chemicals while pregnant to ensure the delivery of a healthy infant.  · Start your prenatal visits by the 12th week of pregnancy. They are usually scheduled monthly at first, then more often in the last 2 months before delivery. Keep your caregiver's appointments. Follow your caregiver's instructions regarding medicine use, blood and lab tests, exercise, and diet.  · During pregnancy, you are providing food for you and your baby. Eat regular, well-balanced  meals. Choose foods such as meat, fish, milk and other low fat dairy products, vegetables, fruits, and whole-grain breads and cereals. Your caregiver will tell you of the ideal weight gain.  · You can help morning sickness by keeping soda crackers at the bedside. Eat a couple before arising in the morning. You may want to use the crackers without salt on them.  · Eating 4 to 5 small meals rather than 3 large meals a day also may help the nausea and vomiting.  · Drinking liquids between meals instead of during meals also seems to help nausea and vomiting.  · A physical sexual relationship may be continued throughout pregnancy if there are no other problems. Problems may be early (premature) leaking of amniotic fluid from the membranes, vaginal bleeding, or belly (abdominal) pain.  · Exercise regularly if there are no restrictions. Check with your caregiver or physical therapist if you are unsure of the safety of some of your exercises. Greater weight gain will occur in the last 2 trimesters of pregnancy. Exercising will help:  · Control your weight.  · Keep you in shape.  · Prepare you for labor and delivery.  · Help you lose your pregnancy weight after you deliver your baby.  · Wear a good support or jogging bra for breast tenderness during pregnancy. This may help if worn during sleep too.  · Ask when prenatal classes are available. Begin classes when they are offered.  · Do not use hot tubs, steam rooms, or saunas.  · Wear your seat belt when driving. This protects you and your baby if you are in an accident.  · Avoid raw meat, uncooked cheese, cat litter boxes, and soil used by cats throughout the pregnancy. These carry germs that can cause birth defects in the baby.  · The first trimester is a good time to visit your dentist for your dental health. Getting your teeth cleaned is okay. Use a softer toothbrush and brush gently during pregnancy.  · Ask for help if you have financial, counseling, or nutritional needs  during pregnancy. Your caregiver will be able to offer counseling for these needs as well as refer you for other special needs.  · Do not take any medicines or herbs unless told by your caregiver.  · Inform your caregiver if there is any mental or physical domestic violence.  · Make a list of emergency phone numbers of family, friends, hospital, and police and fire departments.  · Write down your questions. Take them to your prenatal visit.  · Do not douche.  · Do not cross your legs.  · If you have to stand for long periods of time, rotate you feet or take small steps in a circle.  · You may have more vaginal secretions that may require a sanitary pad. Do not use tampons or scented sanitary pads.  MEDICINES AND DRUG USE IN PREGNANCY  ·   Take prenatal vitamins as directed. The vitamin should contain 1 milligram of folic acid. Keep all vitamins out of reach of children. Only a couple vitamins or tablets containing iron may be fatal to a baby or young child when ingested.  · Avoid use of all medicines, including herbs, over-the-counter medicines, not prescribed or suggested by your caregiver. Only take over-the-counter or prescription medicines for pain, discomfort, or fever as directed by your caregiver. Do not use aspirin, ibuprofen, or naproxen unless directed by your caregiver.  · Let your caregiver also know about herbs you may be using.  · Alcohol is related to a number of birth defects. This includes fetal alcohol syndrome. All alcohol, in any form, should be avoided completely. Smoking will cause low birth rate and premature babies.  · Street or illegal drugs are very harmful to the baby. They are absolutely forbidden. A baby born to an addicted mother will be addicted at birth. The baby will go through the same withdrawal an adult does.  · Let your caregiver know about any medicines that you have to take and for what reason you take them.  SEEK MEDICAL CARE IF:   You have any concerns or worries during your  pregnancy. It is better to call with your questions if you feel they cannot wait, rather than worry about them.  SEEK IMMEDIATE MEDICAL CARE IF:   · An unexplained oral temperature above 102° F (38.9° C) develops, or as your caregiver suggests.  · You have leaking of fluid from the vagina (birth canal). If leaking membranes are suspected, take your temperature and inform your caregiver of this when you call.  · There is vaginal spotting or bleeding. Notify your caregiver of the amount and how many pads are used.  · You develop a bad smelling vaginal discharge with a change in the color.  · You continue to feel sick to your stomach (nauseated) and have no relief from remedies suggested. You vomit blood or coffee ground-like materials.  · You lose more than 2 pounds of weight in 1 week.  · You gain more than 2 pounds of weight in 1 week and you notice swelling of your face, hands, feet, or legs.  · You gain 5 pounds or more in 1 week (even if you do not have swelling of your hands, face, legs, or feet).  · You get exposed to German measles and have never had them.  · You are exposed to fifth disease or chickenpox.  · You develop belly (abdominal) pain. Round ligament discomfort is a common non-cancerous (benign) cause of abdominal pain in pregnancy. Your caregiver still must evaluate this.  · You develop headache, fever, diarrhea, pain with urination, or shortness of breath.  · You fall or are in a car accident or have any kind of trauma.  · There is mental or physical violence in your home.  Document Released: 12/31/2000 Document Revised: 10/01/2011 Document Reviewed: 07/04/2008  ExitCare® Patient Information ©2014 [employer].

## 2013-02-17 NOTE — Progress Notes (Signed)
Pulse: 108 Subjective:     Kiara Juarez is being seen today for her first obstetrical visit.  This is not a planned pregnancy. She is at [redacted]w[redacted]d gestation. Her obstetrical history is significant for ectopic pregnancy . Relationship with FOB: significant other, living together. Patient does intend to breast feed. Pregnancy history fully reviewed.  Menstrual History: OB History   Grav Para Term Preterm Abortions TAB SAB Ect Mult Living   2               Menarche age: 46  Patient's last menstrual period was 12/26/2012.    The following portions of the patient's history were reviewed and updated as appropriate: allergies, current medications, past family history, past medical history, past social history, past surgical history and problem list.  Review of Systems Pertinent items are noted in HPI.    Objective:      General Appearance:    Alert, cooperative, no distress, appears stated age  Head:    Normocephalic, without obvious abnormality, atraumatic  Eyes:    PERRL, conjunctiva/corneas clear, EOM's intact, fundi    benign, both eyes  Ears:    Normal TM's and external ear canals, both ears  Nose:   Nares normal, septum midline, mucosa normal, no drainage    or sinus tenderness  Throat:   Lips, mucosa, and tongue normal; teeth and gums normal  Neck:   Supple, symmetrical, trachea midline, no adenopathy;    thyroid:  no enlargement/tenderness/nodules; no carotid   bruit or JVD  Back:     Symmetric, no curvature, ROM normal, no CVA tenderness  Lungs:     Clear to auscultation bilaterally, respirations unlabored  Chest Wall:    No tenderness or deformity   Heart:    Regular rate and rhythm, S1 and S2 normal, no murmur, rub   or gallop  Breast Exam:    No tenderness, masses, or nipple abnormality  Abdomen:     Soft, non-tender, bowel sounds active all four quadrants,    no masses, no organomegaly  Genitalia:    Normal female without lesion, discharge or tenderness  Extremities:    Extremities normal, atraumatic, no cyanosis or edema  Pulses:   2+ and symmetric all extremities  Skin:   Skin color, texture, turgor normal, no rashes or lesions  Lymph nodes:   Cervical, supraclavicular, and axillary nodes normal  Neurologic:   CNII-XII intact, normal strength, sensation and reflexes    throughout  Informal U/S: IUP; CRL [redacted]w[redacted]d; cardiac activity present      Assessment:    Pregnancy at [redacted]w[redacted]d weeks    Plan:    Initial labs drawn. Prenatal vitamins.  Counseling provided regarding continued use of seat belts, cessation of alcohol consumption, smoking or use of illicit drugs; infection precautions i.e., influenza/TDAP immunizations, toxoplasmosis,CMV, parvovirus, listeria and varicella; workplace safety, exercise during pregnancy; routine dental care, safe medications, sexual activity, hot tubs, saunas, pools, travel, caffeine use, fish and methlymercury, potential toxins, hair treatments, varicose veins Weight gain recommendations per IOM guidelines reviewed:  normal weight/BMI 18.5 - 24.9--> gain 25 - 35 lbs; Problem list reviewed and updated. FIRST/CF mutation testin discussed. Role of ultrasound in pregnancy discussed Follow up in 2 weeks. 50% of 20 min visit spent on counseling and coordination of care.

## 2013-02-18 LAB — VITAMIN D 25 HYDROXY (VIT D DEFICIENCY, FRACTURES): Vit D, 25-Hydroxy: 31 ng/mL (ref 30–89)

## 2013-02-18 LAB — HIV ANTIBODY (ROUTINE TESTING W REFLEX): HIV: NONREACTIVE

## 2013-02-18 LAB — GC/CHLAMYDIA PROBE AMP
CT PROBE, AMP APTIMA: NEGATIVE
GC Probe RNA: NEGATIVE

## 2013-02-18 LAB — CULTURE, OB URINE
Colony Count: NO GROWTH
Organism ID, Bacteria: NO GROWTH

## 2013-02-19 LAB — OBSTETRIC PANEL
ANTIBODY SCREEN: NEGATIVE
Basophils Absolute: 0 10*3/uL (ref 0.0–0.1)
Basophils Relative: 0 % (ref 0–1)
EOS ABS: 0 10*3/uL (ref 0.0–0.7)
EOS PCT: 0 % (ref 0–5)
HEMATOCRIT: 39.9 % (ref 36.0–46.0)
Hemoglobin: 13.6 g/dL (ref 12.0–15.0)
Hepatitis B Surface Ag: NEGATIVE
LYMPHS ABS: 1.1 10*3/uL (ref 0.7–4.0)
LYMPHS PCT: 19 % (ref 12–46)
MCH: 30.7 pg (ref 26.0–34.0)
MCHC: 34.1 g/dL (ref 30.0–36.0)
MCV: 90.1 fL (ref 78.0–100.0)
MONO ABS: 0.3 10*3/uL (ref 0.1–1.0)
MONOS PCT: 5 % (ref 3–12)
Neutro Abs: 4.2 10*3/uL (ref 1.7–7.7)
Neutrophils Relative %: 76 % (ref 43–77)
PLATELETS: 295 10*3/uL (ref 150–400)
RBC: 4.43 MIL/uL (ref 3.87–5.11)
RDW: 13.3 % (ref 11.5–15.5)
RH TYPE: POSITIVE
RUBELLA: 3.38 {index} — AB (ref <0.90)
WBC: 5.6 10*3/uL (ref 4.0–10.5)

## 2013-02-19 LAB — VARICELLA ZOSTER ANTIBODY, IGG: VARICELLA IGG: 1703 {index} — AB (ref <135.00)

## 2013-02-21 ENCOUNTER — Encounter: Payer: Self-pay | Admitting: Obstetrics & Gynecology

## 2013-02-21 LAB — HEMOGLOBINOPATHY EVALUATION
HGB A2 QUANT: 2.5 % (ref 2.2–3.2)
HGB F QUANT: 0 % (ref 0.0–2.0)
HGB S QUANTITAION: 0 %
Hemoglobin Other: 0 %
Hgb A: 97.5 % (ref 96.8–97.8)

## 2013-02-22 ENCOUNTER — Ambulatory Visit (HOSPITAL_COMMUNITY)
Admission: RE | Admit: 2013-02-22 | Discharge: 2013-02-22 | Disposition: A | Discharge status: Home / Self Care | Payer: BC Managed Care – PPO | Source: Ambulatory Visit | Attending: Obstetrics & Gynecology | Admitting: Obstetrics & Gynecology

## 2013-02-22 DIAGNOSIS — Z8759 Personal history of other complications of pregnancy, childbirth and the puerperium: Secondary | ICD-10-CM

## 2013-02-22 DIAGNOSIS — Z3689 Encounter for other specified antenatal screening: Secondary | ICD-10-CM | POA: Insufficient documentation

## 2013-02-22 DIAGNOSIS — O208 Other hemorrhage in early pregnancy: Secondary | ICD-10-CM | POA: Insufficient documentation

## 2013-02-28 ENCOUNTER — Ambulatory Visit (INDEPENDENT_AMBULATORY_CARE_PROVIDER_SITE_OTHER): Payer: BC Managed Care – PPO | Admitting: Obstetrics & Gynecology

## 2013-02-28 VITALS — BP 108/74 | Temp 97.2°F | Wt 148.0 lb

## 2013-02-28 DIAGNOSIS — Z3400 Encounter for supervision of normal first pregnancy, unspecified trimester: Secondary | ICD-10-CM

## 2013-02-28 DIAGNOSIS — N390 Urinary tract infection, site not specified: Secondary | ICD-10-CM

## 2013-02-28 LAB — POCT URINALYSIS DIPSTICK
BILIRUBIN UA: NEGATIVE
Glucose, UA: NEGATIVE
Ketones, UA: NEGATIVE
NITRITE UA: POSITIVE
Spec Grav, UA: 1.025
Urobilinogen, UA: NEGATIVE
pH, UA: 5

## 2013-02-28 MED ORDER — SULFAMETHOXAZOLE-TMP DS 800-160 MG PO TABS: 1.0000 | ORAL_TABLET | Freq: Two times a day (BID) | ORAL | Status: DC

## 2013-02-28 NOTE — Progress Notes (Signed)
Pulse- 83 Pt states she is having pain in her lower back.

## 2013-03-01 DIAGNOSIS — N390 Urinary tract infection, site not specified: Secondary | ICD-10-CM | POA: Insufficient documentation

## 2013-03-01 LAB — CULTURE, OB URINE

## 2013-03-01 NOTE — Progress Notes (Signed)
Cardiac activity on U/S.

## 2013-03-01 NOTE — Patient Instructions (Signed)
Pregnancy - First Trimester  During sexual intercourse, millions of sperm go into the vagina. Only 1 sperm will penetrate and fertilize the female egg while it is in the Fallopian tube. One week later, the fertilized egg implants into the wall of the uterus. An embryo begins to develop into a baby. At 6 to 8 weeks, the eyes and face are formed and the heartbeat can be seen on ultrasound. At the end of 12 weeks (first trimester), all the baby's organs are formed. Now that you are pregnant, you will want to do everything you can to have a healthy baby. Two of the most important things are to get good prenatal care and follow your caregiver's instructions. Prenatal care is all the medical care you receive before the baby's birth. It is given to prevent, find, and treat problems during the pregnancy and childbirth.  PRENATAL EXAMS  · During prenatal visits, your weight, blood pressure, and urine are checked. This is done to make sure you are healthy and progressing normally during the pregnancy.  · A pregnant woman should gain 25 to 35 pounds during the pregnancy. However, if you are overweight or underweight, your caregiver will advise you regarding your weight.  · Your caregiver will ask and answer questions for you.  · Blood work, cervical cultures, other necessary tests, and a Pap test are done during your prenatal exams. These tests are done to check on your health and the probable health of your baby. Tests are strongly recommended and done for HIV with your permission. This is the virus that causes AIDS. These tests are done because medicines can be given to help prevent your baby from being born with this infection should you have been infected without knowing it. Blood work is also used to find out your blood type, previous infections, and follow your blood levels (hemoglobin).  · Low hemoglobin (anemia) is common during pregnancy. Iron and vitamins are given to help prevent this. Later in the pregnancy, blood  tests for diabetes will be done along with any other tests if any problems develop.  · You may need other tests to make sure you and the baby are doing well.  CHANGES DURING THE FIRST TRIMESTER   Your body goes through many changes during pregnancy. They vary from person to person. Talk to your caregiver about changes you notice and are concerned about. Changes can include:  · Your menstrual period stops.  · The egg and sperm carry the genes that determine what you look like. Genes from you and your partner are forming a baby. The female genes determine whether the baby is a boy or a girl.  · Your body increases in girth and you may feel bloated.  · Feeling sick to your stomach (nauseous) and throwing up (vomiting). If the vomiting is uncontrollable, call your caregiver.  · Your breasts will begin to enlarge and become tender.  · Your nipples may stick out more and become darker.  · The need to urinate more. Painful urination may mean you have a bladder infection.  · Tiring easily.  · Loss of appetite.  · Cravings for certain kinds of food.  · At first, you may gain or lose a couple of pounds.  · You may have changes in your emotions from day to day (excited to be pregnant or concerned something may go wrong with the pregnancy and baby).  · You may have more vivid and strange dreams.  HOME CARE INSTRUCTIONS   ·   It is very important to avoid all smoking, alcohol and non-prescribed drugs during your pregnancy. These affect the formation and growth of the baby. Avoid chemicals while pregnant to ensure the delivery of a healthy infant.  · Start your prenatal visits by the 12th week of pregnancy. They are usually scheduled monthly at first, then more often in the last 2 months before delivery. Keep your caregiver's appointments. Follow your caregiver's instructions regarding medicine use, blood and lab tests, exercise, and diet.  · During pregnancy, you are providing food for you and your baby. Eat regular, well-balanced  meals. Choose foods such as meat, fish, milk and other low fat dairy products, vegetables, fruits, and whole-grain breads and cereals. Your caregiver will tell you of the ideal weight gain.  · You can help morning sickness by keeping soda crackers at the bedside. Eat a couple before arising in the morning. You may want to use the crackers without salt on them.  · Eating 4 to 5 small meals rather than 3 large meals a day also may help the nausea and vomiting.  · Drinking liquids between meals instead of during meals also seems to help nausea and vomiting.  · A physical sexual relationship may be continued throughout pregnancy if there are no other problems. Problems may be early (premature) leaking of amniotic fluid from the membranes, vaginal bleeding, or belly (abdominal) pain.  · Exercise regularly if there are no restrictions. Check with your caregiver or physical therapist if you are unsure of the safety of some of your exercises. Greater weight gain will occur in the last 2 trimesters of pregnancy. Exercising will help:  · Control your weight.  · Keep you in shape.  · Prepare you for labor and delivery.  · Help you lose your pregnancy weight after you deliver your baby.  · Wear a good support or jogging bra for breast tenderness during pregnancy. This may help if worn during sleep too.  · Ask when prenatal classes are available. Begin classes when they are offered.  · Do not use hot tubs, steam rooms, or saunas.  · Wear your seat belt when driving. This protects you and your baby if you are in an accident.  · Avoid raw meat, uncooked cheese, cat litter boxes, and soil used by cats throughout the pregnancy. These carry germs that can cause birth defects in the baby.  · The first trimester is a good time to visit your dentist for your dental health. Getting your teeth cleaned is okay. Use a softer toothbrush and brush gently during pregnancy.  · Ask for help if you have financial, counseling, or nutritional needs  during pregnancy. Your caregiver will be able to offer counseling for these needs as well as refer you for other special needs.  · Do not take any medicines or herbs unless told by your caregiver.  · Inform your caregiver if there is any mental or physical domestic violence.  · Make a list of emergency phone numbers of family, friends, hospital, and police and fire departments.  · Write down your questions. Take them to your prenatal visit.  · Do not douche.  · Do not cross your legs.  · If you have to stand for long periods of time, rotate you feet or take small steps in a circle.  · You may have more vaginal secretions that may require a sanitary pad. Do not use tampons or scented sanitary pads.  MEDICINES AND DRUG USE IN PREGNANCY  ·   Take prenatal vitamins as directed. The vitamin should contain 1 milligram of folic acid. Keep all vitamins out of reach of children. Only a couple vitamins or tablets containing iron may be fatal to a baby or young child when ingested.  · Avoid use of all medicines, including herbs, over-the-counter medicines, not prescribed or suggested by your caregiver. Only take over-the-counter or prescription medicines for pain, discomfort, or fever as directed by your caregiver. Do not use aspirin, ibuprofen, or naproxen unless directed by your caregiver.  · Let your caregiver also know about herbs you may be using.  · Alcohol is related to a number of birth defects. This includes fetal alcohol syndrome. All alcohol, in any form, should be avoided completely. Smoking will cause low birth rate and premature babies.  · Street or illegal drugs are very harmful to the baby. They are absolutely forbidden. A baby born to an addicted mother will be addicted at birth. The baby will go through the same withdrawal an adult does.  · Let your caregiver know about any medicines that you have to take and for what reason you take them.  SEEK MEDICAL CARE IF:   You have any concerns or worries during your  pregnancy. It is better to call with your questions if you feel they cannot wait, rather than worry about them.  SEEK IMMEDIATE MEDICAL CARE IF:   · An unexplained oral temperature above 102° F (38.9° C) develops, or as your caregiver suggests.  · You have leaking of fluid from the vagina (birth canal). If leaking membranes are suspected, take your temperature and inform your caregiver of this when you call.  · There is vaginal spotting or bleeding. Notify your caregiver of the amount and how many pads are used.  · You develop a bad smelling vaginal discharge with a change in the color.  · You continue to feel sick to your stomach (nauseated) and have no relief from remedies suggested. You vomit blood or coffee ground-like materials.  · You lose more than 2 pounds of weight in 1 week.  · You gain more than 2 pounds of weight in 1 week and you notice swelling of your face, hands, feet, or legs.  · You gain 5 pounds or more in 1 week (even if you do not have swelling of your hands, face, legs, or feet).  · You get exposed to German measles and have never had them.  · You are exposed to fifth disease or chickenpox.  · You develop belly (abdominal) pain. Round ligament discomfort is a common non-cancerous (benign) cause of abdominal pain in pregnancy. Your caregiver still must evaluate this.  · You develop headache, fever, diarrhea, pain with urination, or shortness of breath.  · You fall or are in a car accident or have any kind of trauma.  · There is mental or physical violence in your home.  Document Released: 12/31/2000 Document Revised: 10/01/2011 Document Reviewed: 07/04/2008  ExitCare® Patient Information ©2014 [employer].

## 2013-03-03 ENCOUNTER — Encounter: Payer: BC Managed Care – PPO | Admitting: Obstetrics & Gynecology

## 2013-03-10 ENCOUNTER — Encounter: Payer: Self-pay | Admitting: Obstetrics & Gynecology

## 2013-03-14 ENCOUNTER — Encounter: Payer: Self-pay | Admitting: Obstetrics & Gynecology

## 2013-03-16 ENCOUNTER — Other Ambulatory Visit: Payer: Self-pay | Admitting: Obstetrics & Gynecology

## 2013-03-16 ENCOUNTER — Ambulatory Visit (INDEPENDENT_AMBULATORY_CARE_PROVIDER_SITE_OTHER): Payer: BC Managed Care – PPO | Admitting: Obstetrics & Gynecology

## 2013-03-16 ENCOUNTER — Encounter (HOSPITAL_COMMUNITY): Payer: Self-pay | Admitting: Obstetrics & Gynecology

## 2013-03-16 VITALS — BP 111/72 | Temp 97.9°F | Wt 147.0 lb

## 2013-03-16 DIAGNOSIS — Z3682 Encounter for antenatal screening for nuchal translucency: Secondary | ICD-10-CM

## 2013-03-16 DIAGNOSIS — Z3480 Encounter for supervision of other normal pregnancy, unspecified trimester: Secondary | ICD-10-CM

## 2013-03-16 LAB — POCT URINALYSIS DIPSTICK
BILIRUBIN UA: NEGATIVE
Ketones, UA: NEGATIVE
Leukocytes, UA: NEGATIVE
NITRITE UA: NEGATIVE
PH UA: 7
Protein, UA: NEGATIVE
RBC UA: NEGATIVE
Spec Grav, UA: 1.015
Urobilinogen, UA: NEGATIVE

## 2013-03-16 NOTE — Progress Notes (Signed)
Pulse 90, patient state no concerns.

## 2013-03-17 ENCOUNTER — Encounter: Payer: BC Managed Care – PPO | Admitting: Obstetrics & Gynecology

## 2013-03-24 ENCOUNTER — Ambulatory Visit (HOSPITAL_COMMUNITY)
Admission: RE | Admit: 2013-03-24 | Discharge: 2013-03-24 | Disposition: A | Discharge status: Home / Self Care | Payer: BC Managed Care – PPO | Source: Ambulatory Visit | Attending: Internal Medicine | Admitting: Internal Medicine

## 2013-03-24 ENCOUNTER — Other Ambulatory Visit (HOSPITAL_COMMUNITY): Payer: BC Managed Care – PPO

## 2013-03-24 ENCOUNTER — Other Ambulatory Visit: Payer: Self-pay

## 2013-03-24 ENCOUNTER — Ambulatory Visit (HOSPITAL_COMMUNITY)
Admission: RE | Admit: 2013-03-24 | Discharge: 2013-03-24 | Disposition: A | Discharge status: Home / Self Care | Payer: BC Managed Care – PPO | Source: Ambulatory Visit | Attending: Obstetrics & Gynecology | Admitting: Obstetrics & Gynecology

## 2013-03-24 DIAGNOSIS — E079 Disorder of thyroid, unspecified: Secondary | ICD-10-CM | POA: Insufficient documentation

## 2013-03-24 DIAGNOSIS — Z3682 Encounter for antenatal screening for nuchal translucency: Secondary | ICD-10-CM

## 2013-03-24 DIAGNOSIS — O3510X0 Maternal care for (suspected) chromosomal abnormality in fetus, unspecified, not applicable or unspecified: Secondary | ICD-10-CM | POA: Insufficient documentation

## 2013-03-24 DIAGNOSIS — N390 Urinary tract infection, site not specified: Secondary | ICD-10-CM

## 2013-03-24 DIAGNOSIS — O351XX0 Maternal care for (suspected) chromosomal abnormality in fetus, not applicable or unspecified: Secondary | ICD-10-CM | POA: Insufficient documentation

## 2013-03-24 DIAGNOSIS — O99280 Endocrine, nutritional and metabolic diseases complicating pregnancy, unspecified trimester: Secondary | ICD-10-CM

## 2013-03-24 DIAGNOSIS — E039 Hypothyroidism, unspecified: Secondary | ICD-10-CM

## 2013-03-27 ENCOUNTER — Encounter: Payer: Self-pay | Admitting: Obstetrics & Gynecology

## 2013-03-27 DIAGNOSIS — Z3400 Encounter for supervision of normal first pregnancy, unspecified trimester: Secondary | ICD-10-CM | POA: Insufficient documentation

## 2013-03-29 ENCOUNTER — Encounter: Payer: Self-pay | Admitting: Obstetrics & Gynecology

## 2013-04-04 ENCOUNTER — Encounter: Payer: Self-pay | Admitting: Obstetrics & Gynecology

## 2013-04-14 ENCOUNTER — Ambulatory Visit (INDEPENDENT_AMBULATORY_CARE_PROVIDER_SITE_OTHER): Payer: BC Managed Care – PPO | Admitting: Obstetrics & Gynecology

## 2013-04-14 VITALS — BP 118/73 | Temp 97.7°F | Wt 150.0 lb

## 2013-04-14 DIAGNOSIS — Z3400 Encounter for supervision of normal first pregnancy, unspecified trimester: Secondary | ICD-10-CM

## 2013-04-14 LAB — POCT URINALYSIS DIPSTICK
Bilirubin, UA: NEGATIVE
Ketones, UA: NEGATIVE
Leukocytes, UA: NEGATIVE
Nitrite, UA: NEGATIVE
Protein, UA: NEGATIVE
RBC UA: NEGATIVE
Spec Grav, UA: <=1.005
UROBILINOGEN UA: NEGATIVE
pH, UA: 8

## 2013-04-14 MED ORDER — VITAFOL-NANO 18-0.6-0.4 MG PO TABS: 1.0000 | ORAL_TABLET | Freq: Every day | ORAL | Status: DC

## 2013-04-14 NOTE — Progress Notes (Signed)
Pulse- 94 Pt states she is having lower back pain. Pt states she is also having trouble sleeping.

## 2013-04-15 LAB — ALPHA FETOPROTEIN, MATERNAL
AFP: 38.3 IU/mL
Curr Gest Age: 15.4 wks.days
MOM FOR AFP: 1.42
Open Spina bifida: NEGATIVE

## 2013-05-02 ENCOUNTER — Other Ambulatory Visit: Payer: Self-pay | Admitting: *Deleted

## 2013-05-02 DIAGNOSIS — Z3400 Encounter for supervision of normal first pregnancy, unspecified trimester: Secondary | ICD-10-CM

## 2013-05-03 ENCOUNTER — Ambulatory Visit (INDEPENDENT_AMBULATORY_CARE_PROVIDER_SITE_OTHER): Payer: BC Managed Care – PPO

## 2013-05-03 ENCOUNTER — Encounter: Payer: Self-pay | Admitting: Obstetrics & Gynecology

## 2013-05-03 ENCOUNTER — Other Ambulatory Visit: Payer: BC Managed Care – PPO

## 2013-05-03 ENCOUNTER — Encounter: Payer: Self-pay | Admitting: Advanced Practice Midwife

## 2013-05-03 ENCOUNTER — Ambulatory Visit (INDEPENDENT_AMBULATORY_CARE_PROVIDER_SITE_OTHER): Payer: BC Managed Care – PPO | Admitting: Advanced Practice Midwife

## 2013-05-03 VITALS — BP 114/77 | Temp 99.3°F | Wt 155.0 lb

## 2013-05-03 DIAGNOSIS — Z3480 Encounter for supervision of other normal pregnancy, unspecified trimester: Secondary | ICD-10-CM

## 2013-05-03 DIAGNOSIS — Z3400 Encounter for supervision of normal first pregnancy, unspecified trimester: Secondary | ICD-10-CM

## 2013-05-03 DIAGNOSIS — Z1389 Encounter for screening for other disorder: Secondary | ICD-10-CM

## 2013-05-03 LAB — POCT URINALYSIS DIPSTICK
Bilirubin, UA: NEGATIVE
Blood, UA: NEGATIVE
Glucose, UA: NEGATIVE
Ketones, UA: NEGATIVE
Leukocytes, UA: NEGATIVE
NITRITE UA: NEGATIVE
PH UA: 7
PROTEIN UA: NEGATIVE
Spec Grav, UA: 1.015
Urobilinogen, UA: NEGATIVE

## 2013-05-03 LAB — US OB COMP ADDL GEST + 14 WK

## 2013-05-03 NOTE — Progress Notes (Signed)
P 86 Patient reports she is doing better with the back pain- patient is still having trouble with hip pain.

## 2013-05-03 NOTE — Progress Notes (Signed)
Subjective: Kiara Juarez is a 28 y.o. at 18 weeks by LMP  Patient denies vaginal leaking of fluid or bleeding, denies contractions.  Denies concerns today. Planning to get TSH screened again today.  Objective: Filed Vitals:   05/03/13 1543  BP: 114/77  Temp: 99.3 F (37.4 C)   160 FHR @ U Fundal Height  Assessment: Patient Active Problem List   Diagnosis Date Noted  . Supervision of normal first pregnancy 03/27/2013  . UTI (lower urinary tract infection) 03/01/2013  . Acute blood loss anemia 04/03/2012  . Tension type headache 03/11/2011  . Depression   . Hypothyroid 10/17/2006  . MOLE 04/21/2006  . GOITER NOS 04/21/2006    Plan: Patient to return to clinic in 4 weeks ROS [place] today, review results w/ patient [place] TSH was reviewed, last level was stable, repeating today w/ her Endocrinologist Review dating w/ patient [place]. I discovered a 1 week discrepancy b/t 8 week and LMP. Review w/ patient once new [place] results are available. Reviewed warning signs in pregnancy. Patient to call with concerns PRN. Reviewed triage location.  20 min spent with patient greater than 80% spent in counseling and coordination of care.   Ayden Hardwick Roni Bread CNM

## 2013-05-04 ENCOUNTER — Encounter: Payer: BC Managed Care – PPO | Admitting: Obstetrics & Gynecology

## 2013-05-04 ENCOUNTER — Other Ambulatory Visit: Payer: BC Managed Care – PPO

## 2013-05-05 ENCOUNTER — Ambulatory Visit: Payer: BC Managed Care – PPO | Admitting: Obstetrics & Gynecology

## 2013-05-11 ENCOUNTER — Other Ambulatory Visit: Payer: BC Managed Care – PPO

## 2013-05-11 ENCOUNTER — Encounter: Payer: BC Managed Care – PPO | Admitting: Obstetrics & Gynecology

## 2013-05-12 ENCOUNTER — Encounter: Payer: BC Managed Care – PPO | Admitting: Obstetrics & Gynecology

## 2013-05-13 ENCOUNTER — Encounter: Payer: Self-pay | Admitting: Obstetrics & Gynecology

## 2013-05-13 LAB — US OB COMP + 14 WK

## 2013-05-25 ENCOUNTER — Other Ambulatory Visit: Payer: Self-pay | Admitting: Obstetrics & Gynecology

## 2013-05-25 DIAGNOSIS — Z1389 Encounter for screening for other disorder: Secondary | ICD-10-CM

## 2013-06-01 ENCOUNTER — Ambulatory Visit (INDEPENDENT_AMBULATORY_CARE_PROVIDER_SITE_OTHER): Payer: BC Managed Care – PPO

## 2013-06-01 ENCOUNTER — Ambulatory Visit (INDEPENDENT_AMBULATORY_CARE_PROVIDER_SITE_OTHER): Payer: BC Managed Care – PPO | Admitting: Obstetrics & Gynecology

## 2013-06-01 VITALS — BP 107/57 | HR 83 | Temp 97.7°F | Wt 161.0 lb

## 2013-06-01 DIAGNOSIS — Z3400 Encounter for supervision of normal first pregnancy, unspecified trimester: Secondary | ICD-10-CM

## 2013-06-01 DIAGNOSIS — Z1389 Encounter for screening for other disorder: Secondary | ICD-10-CM

## 2013-06-01 LAB — US OB FOLLOW UP

## 2013-06-01 LAB — POCT URINALYSIS DIPSTICK
Glucose, UA: NEGATIVE
Ketones, UA: NEGATIVE
LEUKOCYTES UA: NEGATIVE
NITRITE UA: NEGATIVE
Protein, UA: NEGATIVE
RBC UA: NEGATIVE
Spec Grav, UA: <=1.005
pH, UA: 8

## 2013-06-01 NOTE — Progress Notes (Signed)
Subjective:    Kiara Juarez is a 28 y.o. female being seen today for her obstetrical visit. She is at [redacted]w[redacted]d gestation. Patient reports: no complaints . Fetal movement: normal.  Problem List Items Addressed This Visit   Supervision of normal first pregnancy - Primary   Relevant Orders      POCT urinalysis dipstick     Patient Active Problem List   Diagnosis Date Noted  . Supervision of normal first pregnancy 03/27/2013  . UTI (lower urinary tract infection) 03/01/2013  . Acute blood loss anemia 04/03/2012  . Tension type headache 03/11/2011  . Depression   . Hypothyroid 10/17/2006  . MOLE 04/21/2006  . GOITER NOS 04/21/2006   Objective:    BP 107/57  Pulse 83  Temp(Src) 97.7 F (36.5 C)  Wt 73.029 kg (161 lb)  LMP 12/26/2012 FHT: 160 BPM  Uterine Size: size equals dates     Assessment:    Pregnancy @ [redacted]w[redacted]d    Plan:    OBGCT: ordered for next visit. Signs and symptoms of preterm labor: discussed. Smoking cessation discussed never smoked. Labs, problem list reviewed and updated Follow up in 4 weeks.

## 2013-06-03 ENCOUNTER — Encounter: Payer: Self-pay | Admitting: Obstetrics & Gynecology

## 2013-06-05 NOTE — Patient Instructions (Signed)
Glucose Tolerance Test This is a test to see how your body processes carbohydrates. This test is often done to check patients for diabetes or the possibility of developing it. PREPARATION FOR TEST You should have nothing to eat or drink 12 hours before the test. You will be given a form of sugar (glucose) and then blood samples will be drawn from your vein to determine the level of sugar in your blood. Alternatively, blood may be drawn from your finger for testing. You should not smoke or exercise during the test. NORMAL FINDINGS  Fasting: 70-115 mg/dL  30 minutes: less than 200 mg/dL  1 hour: less than 200 mg/dL  2 hours: less than 140 mg/dL  3 hours: 70-115 mg/dL  4 hours: 70-115 mg/dL Ranges for normal findings may vary among different laboratories and hospitals. You should always check with your doctor after having lab work or other tests done to discuss the meaning of your test results and whether your values are considered within normal limits. MEANING OF TEST Your caregiver will go over the test results with you and discuss the importance and meaning of your results, as well as treatment options and the need for additional tests. OBTAINING THE TEST RESULTS It is your responsibility to obtain your test results. Ask the lab or department performing the test when and how you will get your results. Document Released: 01/30/2004 Document Revised: 03/31/2011 Document Reviewed: 12/18/2007 [employer] Patient Information 2014 [employer].  

## 2013-06-06 ENCOUNTER — Encounter: Payer: BC Managed Care – PPO | Admitting: Obstetrics & Gynecology

## 2013-06-29 ENCOUNTER — Other Ambulatory Visit: Payer: BC Managed Care – PPO

## 2013-06-29 ENCOUNTER — Encounter: Payer: Self-pay | Admitting: Obstetrics

## 2013-06-29 ENCOUNTER — Ambulatory Visit (INDEPENDENT_AMBULATORY_CARE_PROVIDER_SITE_OTHER): Payer: BC Managed Care – PPO | Admitting: Obstetrics

## 2013-06-29 VITALS — BP 112/62 | HR 81 | Temp 97.9°F | Wt 167.0 lb

## 2013-06-29 DIAGNOSIS — Z3400 Encounter for supervision of normal first pregnancy, unspecified trimester: Secondary | ICD-10-CM

## 2013-06-29 LAB — POCT URINALYSIS DIPSTICK
BILIRUBIN UA: NEGATIVE
GLUCOSE UA: NEGATIVE
Ketones, UA: NEGATIVE
Leukocytes, UA: NEGATIVE
Nitrite, UA: NEGATIVE
PH UA: 8
Protein, UA: NEGATIVE
RBC UA: NEGATIVE
Urobilinogen, UA: NEGATIVE

## 2013-06-29 NOTE — Progress Notes (Signed)
Subjective:    Kiara Juarez is a 28 y.o. female being seen today for her obstetrical visit. She is at [redacted]w[redacted]d gestation. Patient reports: no complaints . Fetal movement: normal.  Problem List Items Addressed This Visit   Supervision of normal first pregnancy - Primary   Relevant Orders      Glucose Tolerance, 2 Hours w/1 Hour      CBC      HIV antibody      RPR      POCT urinalysis dipstick (Completed)     Patient Active Problem List   Diagnosis Date Noted  . Supervision of normal first pregnancy 03/27/2013  . UTI (lower urinary tract infection) 03/01/2013  . Acute blood loss anemia 04/03/2012  . Tension type headache 03/11/2011  . Depression   . Hypothyroid 10/17/2006  . MOLE 04/21/2006  . GOITER NOS 04/21/2006   Objective:    BP 112/62  Pulse 81  Temp(Src) 97.9 F (36.6 C)  Wt 167 lb (75.751 kg)  LMP 12/26/2012 FHT: 160 BPM  Uterine Size: size equals dates     Assessment:    Pregnancy @ [redacted]w[redacted]d    Plan:    OBGCT: ordered. Signs and symptoms of preterm labor: discussed.  Labs, problem list reviewed and updated 2 hr GTT planned Follow up in 2 weeks.

## 2013-06-30 LAB — CBC
HCT: 37 % (ref 36.0–46.0)
HEMOGLOBIN: 12.2 g/dL (ref 12.0–15.0)
MCH: 31.3 pg (ref 26.0–34.0)
MCHC: 33 g/dL (ref 30.0–36.0)
MCV: 94.9 fL (ref 78.0–100.0)
Platelets: 270 10*3/uL (ref 150–400)
RBC: 3.9 MIL/uL (ref 3.87–5.11)
RDW: 13.6 % (ref 11.5–15.5)
WBC: 6.3 10*3/uL (ref 4.0–10.5)

## 2013-06-30 LAB — GLUCOSE TOLERANCE, 2 HOURS W/ 1HR
GLUCOSE: 118 mg/dL (ref 70–170)
Glucose, 2 hour: 77 mg/dL (ref 70–139)
Glucose, Fasting: 66 mg/dL — ABNORMAL LOW (ref 70–99)

## 2013-06-30 LAB — RPR

## 2013-06-30 LAB — HIV ANTIBODY (ROUTINE TESTING W REFLEX): HIV 1&2 Ab, 4th Generation: NONREACTIVE

## 2013-07-05 ENCOUNTER — Encounter: Payer: Self-pay | Admitting: Obstetrics

## 2013-07-05 ENCOUNTER — Ambulatory Visit (INDEPENDENT_AMBULATORY_CARE_PROVIDER_SITE_OTHER): Payer: BC Managed Care – PPO | Admitting: Obstetrics

## 2013-07-05 VITALS — BP 117/72 | HR 96 | Temp 97.7°F | Wt 168.0 lb

## 2013-07-05 DIAGNOSIS — Z3400 Encounter for supervision of normal first pregnancy, unspecified trimester: Secondary | ICD-10-CM

## 2013-07-05 DIAGNOSIS — O368190 Decreased fetal movements, unspecified trimester, not applicable or unspecified: Secondary | ICD-10-CM

## 2013-07-05 LAB — POCT URINALYSIS DIPSTICK
BILIRUBIN UA: NEGATIVE
Glucose, UA: NEGATIVE
Ketones, UA: NEGATIVE
Leukocytes, UA: NEGATIVE
Nitrite, UA: NEGATIVE
Protein, UA: NEGATIVE
RBC UA: NEGATIVE
Spec Grav, UA: 1.01
Urobilinogen, UA: NEGATIVE
pH, UA: 7

## 2013-07-05 NOTE — Progress Notes (Signed)
Subjective:    Kiara Juarez is a 28 y.o. female being seen today for her obstetrical visit. She is at [redacted]w[redacted]d gestation. Patient reports: decreased fetal movement . Fetal movement: normal.  Problem List Items Addressed This Visit   Supervision of normal first pregnancy - Primary   Relevant Orders      POCT urinalysis dipstick      Fetal non-stress test     Patient Active Problem List   Diagnosis Date Noted  . Supervision of normal first pregnancy 03/27/2013  . UTI (lower urinary tract infection) 03/01/2013  . Acute blood loss anemia 04/03/2012  . Tension type headache 03/11/2011  . Depression   . Hypothyroid 10/17/2006  . MOLE 04/21/2006  . GOITER NOS 04/21/2006   Objective:    BP 117/72  Pulse 96  Temp(Src) 97.7 F (36.5 C)  Wt 168 lb (76.204 kg)  LMP 12/26/2012 FHT: 160 BPM  Uterine Size: size equals dates     Assessment:    Pregnancy @ [redacted]w[redacted]d    Vaginitis  Plan:    Signs and symptoms of preterm labor: discussed.  Labs, problem list reviewed and updated 2 hr GTT planned Follow up in 1 weeks.

## 2013-07-14 ENCOUNTER — Ambulatory Visit (INDEPENDENT_AMBULATORY_CARE_PROVIDER_SITE_OTHER): Payer: BC Managed Care – PPO | Admitting: Obstetrics & Gynecology

## 2013-07-14 ENCOUNTER — Encounter: Payer: Self-pay | Admitting: Obstetrics & Gynecology

## 2013-07-14 VITALS — BP 107/70 | HR 83 | Temp 98.4°F | Wt 170.0 lb

## 2013-07-14 DIAGNOSIS — Z3403 Encounter for supervision of normal first pregnancy, third trimester: Secondary | ICD-10-CM

## 2013-07-14 DIAGNOSIS — Z3400 Encounter for supervision of normal first pregnancy, unspecified trimester: Secondary | ICD-10-CM

## 2013-07-17 NOTE — Progress Notes (Signed)
Subjective:    Kiara Juarez is a 28 y.o. female being seen today for her obstetrical visit. She is at [redacted]w[redacted]d gestation. Patient reports no complaints. Fetal movement: normal.  Problem List Items Addressed This Visit   Supervision of normal first pregnancy - Primary   Relevant Orders      POCT urinalysis dipstick     Patient Active Problem List   Diagnosis Date Noted  . Supervision of normal first pregnancy 03/27/2013  . UTI (lower urinary tract infection) 03/01/2013  . Acute blood loss anemia 04/03/2012  . Tension type headache 03/11/2011  . Depression   . Hypothyroid 10/17/2006  . MOLE 04/21/2006  . GOITER NOS 04/21/2006   Objective:    BP 107/70  Pulse 83  Temp(Src) 98.4 F (36.9 C)  Wt 77.111 kg (170 lb)  LMP 12/26/2012 FHT:  150 BPM  Uterine Size: size equals dates  Presentation: cephalic     Assessment:    Pregnancy @ [redacted]w[redacted]d weeks   Plan:     labs reviewed, problem list updated Consent signed. TDAP offered  Orders Placed This Encounter  Procedures  . POCT urinalysis dipstick    Follow up in 4 weeks.

## 2013-07-17 NOTE — Patient Instructions (Signed)
Tetanus, Diphtheria, Pertussis (Tdap) Vaccine  What You Need to Know  WHY GET VACCINATED?  Tetanus, diphtheria and pertussis can be very serious diseases, even for adolescents and adults. Tdap vaccine can protect us from these diseases.  TETANUS (Lockjaw) causes painful muscle tightening and stiffness, usually all over the body.  · It can lead to tightening of muscles in the head and neck so you can't open your mouth, swallow, or sometimes even breathe. Tetanus kills about 1 out of 5 people who are infected.  DIPHTHERIA can cause a thick coating to form in the back of the throat.  · It can lead to breathing problems, paralysis, heart failure, and death.  PERTUSSIS (Whooping Cough) causes severe coughing spells, which can cause difficulty breathing, vomiting and disturbed sleep.  · It can also lead to weight loss, incontinence, and rib fractures. Up to 2 in 100 adolescents and 5 in 100 adults with pertussis are hospitalized or have complications, which could include pneumonia and death.  These diseases are caused by bacteria. Diphtheria and pertussis are spread from person to person through coughing or sneezing. Tetanus enters the body through cuts, scratches, or wounds.  Before vaccines, the [place] saw as many as 200,000 cases a year of diphtheria and pertussis, and hundreds of cases of tetanus. Since vaccination began, tetanus and diphtheria have dropped by about 99% and pertussis by about 80%.  TDAP VACCINE  Tdap vaccine can protect adolescents and adults from tetanus, diphtheria, and pertussis. One dose of Tdap is routinely given at age 11 or 12. People who did not get Tdap at that age should get it as soon as possible.  Tdap is especially important for health care professionals and anyone having close contact with a baby younger than 12 months.  Pregnant women should get a dose of Tdap during every pregnancy, to protect the newborn from pertussis. Infants are most at risk for severe, life-threatening  complications from pertussis.  A similar vaccine, called Td, protects from tetanus and diphtheria, but not pertussis. A Td booster should be given every 10 years. Tdap may be given as one of these boosters if you have not already gotten a dose. Tdap may also be given after a severe cut or burn to prevent tetanus infection.  Your doctor can give you more information.  Tdap may safely be given at the same time as other vaccines.  SOME PEOPLE SHOULD NOT GET THIS VACCINE  · If you ever had a life-threatening allergic reaction after a dose of any tetanus, diphtheria, or pertussis containing vaccine, OR if you have a severe allergy to any part of this vaccine, you should not get Tdap. Tell your doctor if you have any severe allergies.  · If you had a coma, or long or multiple seizures within 7 days after a childhood dose of DTP or DTaP, you should not get Tdap, unless a cause other than the vaccine was found. You can still get Td.  · Talk to your doctor if you:  ¨ have epilepsy or another nervous system problem,  ¨ had severe pain or swelling after any vaccine containing diphtheria, tetanus or pertussis,  ¨ ever had Guillain-Barré Syndrome (GBS),  ¨ aren't feeling well on the day the shot is scheduled.  RISKS OF A VACCINE REACTION  With any medicine, including vaccines, there is a chance of side effects. These are usually mild and go away on their own, but serious reactions are also possible.  Brief fainting spells can follow   a vaccination, leading to injuries from falling. Sitting or lying down for about 15 minutes can help prevent these. Tell your doctor if you feel dizzy or light-headed, or have vision changes or ringing in the ears.  Mild problems following Tdap (Did not interfere with activities)  · Pain where the shot was given (about 3 in 4 adolescents or 2 in 3 adults)  · Redness or swelling where the shot was given (about 1 person in 5)  · Mild fever of at least 100.4°F (up to about 1 in 25 adolescents or 1 in  100 adults)  · Headache (about 3 or 4 people in 10)  · Tiredness (about 1 person in 3 or 4)  · Nausea, vomiting, diarrhea, stomach ache (up to 1 in 4 adolescents or 1 in 10 adults)  · Chills, body aches, sore joints, rash, swollen glands (uncommon)  Moderate problems following Tdap (Interfered with activities, but did not require medical attention)  · Pain where the shot was given (about 1 in 5 adolescents or 1 in 100 adults)  · Redness or swelling where the shot was given (up to about 1 in 16 adolescents or 1 in 25 adults)  · Fever over 102°F (about 1 in 100 adolescents or 1 in 250 adults)  · Headache (about 3 in 20 adolescents or 1 in 10 adults)  · Nausea, vomiting, diarrhea, stomach ache (up to 1 or 3 people in 100)  · Swelling of the entire arm where the shot was given (up to about 3 in 100).  Severe problems following Tdap (Unable to perform usual activities, required medical attention)  · Swelling, severe pain, bleeding and redness in the arm where the shot was given (rare).  A severe allergic reaction could occur after any vaccine (estimated less than 1 in a million doses).  WHAT IF THERE IS A SERIOUS REACTION?  What should I look for?  · Look for anything that concerns you, such as signs of a severe allergic reaction, very high fever, or behavior changes.  Signs of a severe allergic reaction can include hives, swelling of the face and throat, difficulty breathing, a fast heartbeat, dizziness, and weakness. These would start a few minutes to a few hours after the vaccination.  What should I do?  · If you think it is a severe allergic reaction or other emergency that can't wait, call 9-1-1 or get the person to the nearest hospital. Otherwise, call your doctor.  · Afterward, the reaction should be reported to the "Vaccine Adverse Event Reporting System" (VAERS). Your doctor might file this report, or you can do it yourself through the VAERS web site at www.vaers.hhs.gov, or by calling 1-800-822-7967.  VAERS is  only for reporting reactions. They do not give medical advice.   THE NATIONAL VACCINE INJURY COMPENSATION PROGRAM  The National Vaccine Injury Compensation Program (VICP) is a federal program that was created to compensate people who may have been injured by certain vaccines.  Persons who believe they may have been injured by a vaccine can learn about the program and about filing a claim by calling 1-800-338-2382 or visiting the VICP website at www.hrsa.gov/vaccinecompensation.  HOW CAN I LEARN MORE?  · Ask your doctor.  · Call your local or state health department.  · Contact the Centers for Disease Control and Prevention (CDC):  ¨ Call 1-800-232-4636 or visit CDC's website at www.cdc.gov/vaccines.  CDC Tdap Vaccine VIS (05/29/11)  Document Released: 07/08/2011 Document Revised: 05/03/2012 Document Reviewed: 04/28/2012  Exit[employer]®   Patient Information ©2015 [employer]. This information is not intended to replace advice given to you by your health care Kiara Juarez. Make sure you discuss any questions you have with your health care Kiara Juarez.

## 2013-07-18 LAB — POCT URINALYSIS DIPSTICK
Bilirubin, UA: NEGATIVE
Blood, UA: NEGATIVE
GLUCOSE UA: NEGATIVE
KETONES UA: NEGATIVE
Leukocytes, UA: NEGATIVE
Nitrite, UA: NEGATIVE
Spec Grav, UA: <=1.005
Urobilinogen, UA: NEGATIVE
pH, UA: 7

## 2013-07-21 ENCOUNTER — Ambulatory Visit (INDEPENDENT_AMBULATORY_CARE_PROVIDER_SITE_OTHER): Payer: BC Managed Care – PPO | Admitting: Obstetrics & Gynecology

## 2013-07-21 VITALS — BP 111/73 | HR 82 | Temp 97.7°F | Wt 173.0 lb

## 2013-07-21 DIAGNOSIS — Z3403 Encounter for supervision of normal first pregnancy, third trimester: Secondary | ICD-10-CM

## 2013-07-21 DIAGNOSIS — L299 Pruritus, unspecified: Secondary | ICD-10-CM

## 2013-07-21 DIAGNOSIS — Z3400 Encounter for supervision of normal first pregnancy, unspecified trimester: Secondary | ICD-10-CM

## 2013-07-21 MED ORDER — TRAMADOL HCL 50 MG PO TABS: 50.0000 mg | ORAL_TABLET | Freq: Four times a day (QID) | ORAL | Status: DC | PRN

## 2013-07-21 NOTE — Progress Notes (Deleted)
Patient is in the office for problem visit. She is having pain and itching of her skin.

## 2013-07-22 LAB — BILE ACIDS, TOTAL: Bile Acids Total: 7 umol/L (ref 0–19)

## 2013-07-22 LAB — VARICELLA ZOSTER ANTIBODY, IGG: Varicella IgG: 2051 Index — ABNORMAL HIGH (ref <135.00)

## 2013-07-24 NOTE — Progress Notes (Signed)
Subjective:    Kiara Juarez is a 28 y.o. female being seen today for her obstetrical visit. She is at [redacted]w[redacted]d gestation. Patient reports burning, itching in skin, epigastric, midline. Fetal movement: normal.  Problem List Items Addressed This Visit   None    Visit Diagnoses   Encounter for supervision of normal first pregnancy in third trimester    -  Primary    Relevant Orders       POCT urinalysis dipstick    Itching        Relevant Orders       Varicella zoster antibody, IgG (Completed)       Bile acids, total (Completed)      Patient Active Problem List   Diagnosis Date Noted  . Supervision of normal first pregnancy 03/27/2013  . UTI (lower urinary tract infection) 03/01/2013  . Acute blood loss anemia 04/03/2012  . Tension type headache 03/11/2011  . Depression   . Hypothyroid 10/17/2006  . MOLE 04/21/2006  . GOITER NOS 04/21/2006   Objective:    BP 111/73  Pulse 82  Temp(Src) 97.7 F (36.5 C)  Wt 78.472 kg (173 lb)  LMP 12/26/2012 FHT:  150 BPM  Uterine Size: size equals dates  Presentation: unsure     Assessment:    Pregnancy @ [redacted]w[redacted]d weeks  ?Etiology of complaints--?Zoster prior to skin lesions, doubt cholecystitis  Plan:     labs reviewed, problem list updated Supportive measures reviewed Orders Placed This Encounter  Procedures  . Varicella zoster antibody, IgG  . Bile acids, total  . POCT urinalysis dipstick    Follow up in 1week.

## 2013-07-25 ENCOUNTER — Other Ambulatory Visit: Payer: Self-pay | Admitting: *Deleted

## 2013-07-25 LAB — POCT URINALYSIS DIPSTICK
BILIRUBIN UA: NEGATIVE
Blood, UA: NEGATIVE
Glucose, UA: NEGATIVE
Ketones, UA: NEGATIVE
LEUKOCYTES UA: NEGATIVE
NITRITE UA: NEGATIVE
Protein, UA: NEGATIVE
Spec Grav, UA: 1.01
Urobilinogen, UA: NEGATIVE
pH, UA: 7

## 2013-07-25 MED ORDER — VALACYCLOVIR HCL 1 G PO TABS: 1000.0000 mg | ORAL_TABLET | Freq: Three times a day (TID) | ORAL | Status: DC

## 2013-07-28 ENCOUNTER — Encounter: Payer: Self-pay | Admitting: Obstetrics & Gynecology

## 2013-07-28 ENCOUNTER — Ambulatory Visit (INDEPENDENT_AMBULATORY_CARE_PROVIDER_SITE_OTHER): Payer: BC Managed Care – PPO | Admitting: Obstetrics & Gynecology

## 2013-07-28 VITALS — BP 101/64 | HR 83 | Temp 97.6°F | Wt 176.0 lb

## 2013-07-28 DIAGNOSIS — Z3400 Encounter for supervision of normal first pregnancy, unspecified trimester: Secondary | ICD-10-CM

## 2013-07-28 DIAGNOSIS — B029 Zoster without complications: Secondary | ICD-10-CM | POA: Insufficient documentation

## 2013-07-28 DIAGNOSIS — Z3403 Encounter for supervision of normal first pregnancy, third trimester: Secondary | ICD-10-CM

## 2013-07-29 NOTE — Progress Notes (Signed)
Subjective:    Kiara Juarez is a 28 y.o. female being seen today for her obstetrical visit. She is at [redacted]w[redacted]d gestation. Patient reports marked improvement in symptoms. Fetal movement: normal.  Problem List Items Addressed This Visit   Supervision of normal first pregnancy - Primary   Relevant Orders      POCT urinalysis dipstick      US OB Follow Up   Herpes zoster     Patient Active Problem List   Diagnosis Date Noted  . Herpes zoster 07/28/2013  . Supervision of normal first pregnancy 03/27/2013  . UTI (lower urinary tract infection) 03/01/2013  . Acute blood loss anemia 04/03/2012  . Tension type headache 03/11/2011  . Depression   . Hypothyroid 10/17/2006  . MOLE 04/21/2006  . GOITER NOS 04/21/2006   Objective:    BP 101/64  Pulse 83  Temp(Src) 97.6 F (36.4 C)  Wt 79.833 kg (176 lb)  LMP 12/26/2012 FHT:  140 BPM  Uterine Size: size equals dates  Presentation: cephalic     Assessment:    Pregnancy @ [redacted]w[redacted]d weeks  Likely Herpes Zoster w/o rask--symptomatic improvement   Plan:     labs reviewed, problem list updated Orders Placed This Encounter  Procedures  . US OB Follow Up    Standing Status: Future     Number of Occurrences:      Standing Expiration Date: 09/29/2014    Order Specific Question:  Reason for Exam (SYMPTOM  OR DIAGNOSIS REQUIRED)    Answer:  Growth    Order Specific Question:  Preferred imaging location?    Answer:  [facility]  . POCT urinalysis dipstick    Follow up in 2 weeks.

## 2013-07-29 NOTE — Patient Instructions (Signed)
Third Trimester of Pregnancy The third trimester is from week 29 through week 42, months 7 through 9. The third trimester is a time when the fetus is growing rapidly. At the end of the ninth month, the fetus is about 20 inches in length and weighs 6-10 pounds.  BODY CHANGES Your body goes through many changes during pregnancy. The changes vary from woman to woman.   Your weight will continue to increase. You can expect to gain 25-35 pounds (11-16 kg) by the end of the pregnancy.  You may begin to get stretch marks on your hips, abdomen, and breasts.  You may urinate more often because the fetus is moving lower into your pelvis and pressing on your bladder.  You may develop or continue to have heartburn as a result of your pregnancy.  You may develop constipation because certain hormones are causing the muscles that push waste through your intestines to slow down.  You may develop hemorrhoids or swollen, bulging veins (varicose veins).  You may have pelvic pain because of the weight gain and pregnancy hormones relaxing your joints between the bones in your pelvis. Backaches may result from overexertion of the muscles supporting your posture.  You may have changes in your hair. These can include thickening of your hair, rapid growth, and changes in texture. Some women also have hair loss during or after pregnancy, or hair that feels dry or thin. Your hair will most likely return to normal after your baby is born.  Your breasts will continue to grow and be tender. A yellow discharge may leak from your breasts called colostrum.  Your belly button may stick out.  You may feel short of breath because of your expanding uterus.  You may notice the fetus "dropping," or moving lower in your abdomen.  You may have a bloody mucus discharge. This usually occurs a few days to a week before labor begins.  Your cervix becomes thin and soft (effaced) near your due date. WHAT TO EXPECT AT YOUR PRENATAL  EXAMS  You will have prenatal exams every 2 weeks until week 36. Then, you will have weekly prenatal exams. During a routine prenatal visit:  You will be weighed to make sure you and the fetus are growing normally.  Your blood pressure is taken.  Your abdomen will be measured to track your baby's growth.  The fetal heartbeat will be listened to.  Any test results from the previous visit will be discussed.  You may have a cervical check near your due date to see if you have effaced. At around 36 weeks, your caregiver will check your cervix. At the same time, your caregiver will also perform a test on the secretions of the vaginal tissue. This test is to determine if a type of bacteria, Group B streptococcus, is present. Your caregiver will explain this further. Your caregiver may ask you:  What your birth plan is.  How you are feeling.  If you are feeling the baby move.  If you have had any abnormal symptoms, such as leaking fluid, bleeding, severe headaches, or abdominal cramping.  If you have any questions. Other tests or screenings that may be performed during your third trimester include:  Blood tests that check for low iron levels (anemia).  Fetal testing to check the health, activity level, and growth of the fetus. Testing is done if you have certain medical conditions or if there are problems during the pregnancy. FALSE LABOR You may feel small, irregular contractions that   eventually go away. These are called Braxton Hicks contractions, or false labor. Contractions may last for hours, days, or even weeks before true labor sets in. If contractions come at regular intervals, intensify, or become painful, it is best to be seen by your caregiver.  SIGNS OF LABOR   Menstrual-like cramps.  Contractions that are 5 minutes apart or less.  Contractions that start on the top of the uterus and spread down to the lower abdomen and back.  A sense of increased pelvic pressure or back  pain.  A watery or bloody mucus discharge that comes from the vagina. If you have any of these signs before the 37th week of pregnancy, call your caregiver right away. You need to go to the hospital to get checked immediately. HOME CARE INSTRUCTIONS   Avoid all smoking, herbs, alcohol, and unprescribed drugs. These chemicals affect the formation and growth of the baby.  Follow your caregiver's instructions regarding medicine use. There are medicines that are either safe or unsafe to take during pregnancy.  Exercise only as directed by your caregiver. Experiencing uterine cramps is a good sign to stop exercising.  Continue to eat regular, healthy meals.  Wear a good support bra for breast tenderness.  Do not use hot tubs, steam rooms, or saunas.  Wear your seat belt at all times when driving.  Avoid raw meat, uncooked cheese, cat litter boxes, and soil used by cats. These carry germs that can cause birth defects in the baby.  Take your prenatal vitamins.  Try taking a stool softener (if your caregiver approves) if you develop constipation. Eat more high-fiber foods, such as fresh vegetables or fruit and whole grains. Drink plenty of fluids to keep your urine clear or pale yellow.  Take warm sitz baths to soothe any pain or discomfort caused by hemorrhoids. Use hemorrhoid cream if your caregiver approves.  If you develop varicose veins, wear support hose. Elevate your feet for 15 minutes, 3-4 times a day. Limit salt in your diet.  Avoid heavy lifting, wear low heal shoes, and practice good posture.  Rest a lot with your legs elevated if you have leg cramps or low back pain.  Visit your dentist if you have not gone during your pregnancy. Use a soft toothbrush to brush your teeth and be gentle when you floss.  A sexual relationship may be continued unless your caregiver directs you otherwise.  Do not travel far distances unless it is absolutely necessary and only with the approval  of your caregiver.  Take prenatal classes to understand, practice, and ask questions about the labor and delivery.  Make a trial run to the hospital.  Pack your hospital bag.  Prepare the baby's nursery.  Continue to go to all your prenatal visits as directed by your caregiver. SEEK MEDICAL CARE IF:  You are unsure if you are in labor or if your water has broken.  You have dizziness.  You have mild pelvic cramps, pelvic pressure, or nagging pain in your abdominal area.  You have persistent nausea, vomiting, or diarrhea.  You have a bad smelling vaginal discharge.  You have pain with urination. SEEK IMMEDIATE MEDICAL CARE IF:   You have a fever.  You are leaking fluid from your vagina.  You have spotting or bleeding from your vagina.  You have severe abdominal cramping or pain.  You have rapid weight loss or gain.  You have shortness of breath with chest pain.  You notice sudden or extreme swelling   of your face, hands, ankles, feet, or legs.  You have not felt your baby move in over an hour.  You have severe headaches that do not go away with medicine.  You have vision changes. Document Released: 12/31/2000 Document Revised: 01/11/2013 Document Reviewed: 03/09/2012 Exit[employer] Patient Information 2015 [employer]. This information is not intended to replace advice given to you by your health care provider. Make sure you discuss any questions you have with your health care provider.  

## 2013-08-01 LAB — POCT URINALYSIS DIPSTICK
Bilirubin, UA: NEGATIVE
Blood, UA: NEGATIVE
Glucose, UA: NEGATIVE
Ketones, UA: NEGATIVE
Leukocytes, UA: NEGATIVE
Nitrite, UA: NEGATIVE
Protein, UA: NEGATIVE
Spec Grav, UA: 1.01
Urobilinogen, UA: NEGATIVE
pH, UA: 7

## 2013-08-02 ENCOUNTER — Other Ambulatory Visit: Payer: Self-pay | Admitting: Obstetrics & Gynecology

## 2013-08-02 ENCOUNTER — Ambulatory Visit (HOSPITAL_COMMUNITY)
Admission: RE | Admit: 2013-08-02 | Discharge: 2013-08-02 | Disposition: A | Discharge status: Home / Self Care | Payer: BC Managed Care – PPO | Source: Ambulatory Visit | Attending: Obstetrics & Gynecology | Admitting: Obstetrics & Gynecology

## 2013-08-02 DIAGNOSIS — Z3403 Encounter for supervision of normal first pregnancy, third trimester: Secondary | ICD-10-CM

## 2013-08-02 DIAGNOSIS — Z3689 Encounter for other specified antenatal screening: Secondary | ICD-10-CM | POA: Insufficient documentation

## 2013-08-10 ENCOUNTER — Encounter: Payer: BC Managed Care – PPO | Admitting: Obstetrics & Gynecology

## 2013-08-15 ENCOUNTER — Ambulatory Visit (INDEPENDENT_AMBULATORY_CARE_PROVIDER_SITE_OTHER): Payer: BC Managed Care – PPO | Admitting: Obstetrics & Gynecology

## 2013-08-15 ENCOUNTER — Encounter: Payer: BC Managed Care – PPO | Admitting: Obstetrics & Gynecology

## 2013-08-15 ENCOUNTER — Encounter: Payer: Self-pay | Admitting: Obstetrics & Gynecology

## 2013-08-15 VITALS — BP 110/68 | HR 78 | Temp 97.7°F | Wt 176.0 lb

## 2013-08-15 DIAGNOSIS — Z3400 Encounter for supervision of normal first pregnancy, unspecified trimester: Secondary | ICD-10-CM

## 2013-08-15 DIAGNOSIS — Z3403 Encounter for supervision of normal first pregnancy, third trimester: Secondary | ICD-10-CM

## 2013-08-15 LAB — POCT URINALYSIS DIPSTICK
BILIRUBIN UA: NEGATIVE
Glucose, UA: NEGATIVE
KETONES UA: NEGATIVE
Nitrite, UA: NEGATIVE
PH UA: 7
Protein, UA: NEGATIVE
RBC UA: NEGATIVE
Spec Grav, UA: 1.01
Urobilinogen, UA: NEGATIVE

## 2013-08-16 NOTE — Patient Instructions (Signed)
Pain Relief During Labor and Delivery Everyone experiences pain differently, but labor causes severe pain for many women. The amount of pain you experience during labor and delivery depends on your pain tolerance, contraction strength, and your baby's size and position. There are many ways to prepare for and deal with the pain, including:   Taking prenatal classes to learn about labor and delivery. The more informed you are, the less anxious and afraid you may be. This can help lessen the pain.  Taking pain-relieving medicine during labor and delivery.  Learning breathing and relaxation techniques.  Taking a shower or bath.  Getting massaged.  Changing positions.  Placing an ice pack on your back. Discuss your pain control options with your health care provider during your prenatal visits.  WHAT ARE THE TWO TYPES OF PAIN-RELIEVING MEDICINES? 1. Analgesics. These are medicines that decrease pain without total loss of feeling or muscle movement. 2. Anesthetics. These are medicines that block all feeling, including pain. There can be minor side effects of both types, such as nausea, trouble concentrating, becoming sleepy, and lowering the heart rate of the baby. However, health care providers are careful to give doses that will not seriously affect the baby.  WHAT ARE THE SPECIFIC TYPES OF ANALGESICS AND ANESTHETICS? Systemic Analgesic Systemic pain medicines affect your whole body rather than focusing pain relief on the area of your body experiencing pain. This type of medicine is given either through an IV tube in your vein or by a shot (injection) into your muscle. This medicine will lessen your pain but will not stop it completely. It may also make you sleepy, but it will not make you lose consciousness.  Local Anesthetic Local anesthetic isused tonumb a small area of your body. The medicine is injected into the area of nerves that carry feeling to the vagina, vulva, or the area between  the vagina and anus (perineum).  General Anesthetic This type of medicine causes you to lose consciousness so you do not feel pain. It is usually used only in emergency situations during labor. It is given through an IV tube or face mask. Paracervical Block A paracervical block is a form of local anesthesia given during labor. Numbing medicine is injected into the right and left sides of the cervix and vagina. It helps to lessen the pain caused by contractions and stretching of the cervix. It may have to be given more than once.  Pudendal Block A pudendal block is another form of local anesthesia. It is used to relieve the pain associated with pushing or stretching of the perineum at the time of delivery. An injection is given deep through the vaginal wall into the pudendal nerve in the pelvis, numbing the perineum.  Epidural Anesthetic An epidural is an injection of numbing medicine given in the lower back and into the epidural space near your spinal cord. The epidural numbs the lower half of your body. You may be able to move your legs but will not be allowed to walk. Epidurals can be used for labor, delivery, or cesarean deliveries.  To prevent the medicine from wearing off, a small tube (catheter) may be threaded into the epidural space and taped in place to prevent it from slipping out. Medicine can then be given continuously in small doses through the tube until you deliver. Spinal Block A spinal block is similar to an epidural, but the medicine is injected into the spinal fluid, not the epidural space. A spinal block is only given   once. It starts to relieve pain quickly but lasts only 1-2 hours. Spinal blocks can also be used for cesarean deliveries.  Combined Spinal-Epidural Block Combined spinal-epidural blocks combine the benefits of both the spinal and epidural blocks. The spinal part acts quickly to relieve pain and the epidural provides continuous pain relief. Hydrotherapy Immersion in  warm water during labor may provide comfort and relaxation. It may also help to lessen pain, the use of anesthesia, and the length of labor. However, immersion in water during the delivery (water birth) may have some risk involved and studies to determine safety and risks are ongoing. If you are a healthy woman who is expecting an uncomplicated birth, talk with your health care provider to see if water birth is an option for you.  Document Released: 04/24/2008 Document Revised: 01/11/2013 Document Reviewed: 05/27/2012 [employer] Patient Information 2015 [employer]. This information is not intended to replace advice given to you by your health care provider. Make sure you discuss any questions you have with your health care provider.  

## 2013-08-16 NOTE — Progress Notes (Signed)
Subjective:    Kiara Juarez is a 28 y.o. female being seen today for her obstetrical visit. She is at [redacted]w[redacted]d gestation. Patient reports marked improvement in symptoms. Fetal movement: normal.  Problem List Items Addressed This Visit     Unprioritized   Supervision of normal first pregnancy - Primary   Relevant Orders      POCT urinalysis dipstick (Completed)      US OB Follow Up     Patient Active Problem List   Diagnosis Date Noted  . Herpes zoster 07/28/2013  . Supervision of normal first pregnancy 03/27/2013  . UTI (lower urinary tract infection) 03/01/2013  . Acute blood loss anemia 04/03/2012  . Tension type headache 03/11/2011  . Depression   . Hypothyroid 10/17/2006  . MOLE 04/21/2006  . GOITER NOS 04/21/2006   Objective:    BP 110/68  Pulse 78  Temp(Src) 97.7 F (36.5 C)  Wt 79.833 kg (176 lb)  LMP 12/26/2012 FHT:  130 BPM  Uterine Size: size equals dates  Presentation: cephalic     Assessment:    Pregnancy @ [redacted]w[redacted]d weeks   Plan:     labs reviewed, problem list updated Orders Placed This Encounter  Procedures  . US OB Follow Up    Standing Status: Future     Number of Occurrences:      Standing Expiration Date: 10/17/2014    Order Specific Question:  Reason for Exam (SYMPTOM  OR DIAGNOSIS REQUIRED)    Answer:  f/u for anatomy    Order Specific Question:  Preferred imaging location?    Answer:  [facility]  . POCT urinalysis dipstick    Follow up in 2 weeks.

## 2013-09-01 ENCOUNTER — Ambulatory Visit (HOSPITAL_COMMUNITY)
Admission: RE | Admit: 2013-09-01 | Discharge: 2013-09-01 | Disposition: A | Discharge status: Home / Self Care | Payer: BC Managed Care – PPO | Source: Ambulatory Visit | Attending: Obstetrics & Gynecology | Admitting: Obstetrics & Gynecology

## 2013-09-01 DIAGNOSIS — Z3689 Encounter for other specified antenatal screening: Secondary | ICD-10-CM | POA: Diagnosis present

## 2013-09-01 DIAGNOSIS — O358XX0 Maternal care for other (suspected) fetal abnormality and damage, not applicable or unspecified: Secondary | ICD-10-CM

## 2013-09-01 DIAGNOSIS — Z3403 Encounter for supervision of normal first pregnancy, third trimester: Secondary | ICD-10-CM

## 2013-09-01 DIAGNOSIS — O99283 Endocrine, nutritional and metabolic diseases complicating pregnancy, third trimester: Secondary | ICD-10-CM

## 2013-09-01 DIAGNOSIS — E079 Disorder of thyroid, unspecified: Secondary | ICD-10-CM

## 2013-09-05 ENCOUNTER — Encounter: Payer: Self-pay | Admitting: Obstetrics & Gynecology

## 2013-09-05 ENCOUNTER — Ambulatory Visit (INDEPENDENT_AMBULATORY_CARE_PROVIDER_SITE_OTHER): Payer: BC Managed Care – PPO | Admitting: Obstetrics & Gynecology

## 2013-09-05 VITALS — BP 116/75 | HR 82 | Temp 97.7°F | Wt 181.0 lb

## 2013-09-05 DIAGNOSIS — O321XX0 Maternal care for breech presentation, not applicable or unspecified: Secondary | ICD-10-CM

## 2013-09-05 DIAGNOSIS — O3090 Multiple gestation, unspecified, unspecified trimester: Secondary | ICD-10-CM

## 2013-09-05 DIAGNOSIS — Z3400 Encounter for supervision of normal first pregnancy, unspecified trimester: Secondary | ICD-10-CM

## 2013-09-05 DIAGNOSIS — Z23 Encounter for immunization: Secondary | ICD-10-CM

## 2013-09-05 DIAGNOSIS — Z3403 Encounter for supervision of normal first pregnancy, third trimester: Secondary | ICD-10-CM

## 2013-09-05 DIAGNOSIS — O321XX1 Maternal care for breech presentation, fetus 1: Secondary | ICD-10-CM

## 2013-09-05 LAB — POCT URINALYSIS DIPSTICK
BILIRUBIN UA: NEGATIVE
Blood, UA: NEGATIVE
Glucose, UA: NEGATIVE
Ketones, UA: NEGATIVE
Leukocytes, UA: NEGATIVE
NITRITE UA: NEGATIVE
Protein, UA: NEGATIVE
Spec Grav, UA: 1.015
Urobilinogen, UA: NEGATIVE
pH, UA: 6

## 2013-09-06 ENCOUNTER — Telehealth (HOSPITAL_COMMUNITY): Payer: Self-pay | Admitting: *Deleted

## 2013-09-06 DIAGNOSIS — O321XX0 Maternal care for breech presentation, not applicable or unspecified: Secondary | ICD-10-CM | POA: Insufficient documentation

## 2013-09-06 LAB — STREP B DNA PROBE: GBSP: DETECTED

## 2013-09-06 NOTE — Progress Notes (Signed)
Subjective:    Kiara Juarez is a 28 y.o. female being seen today for her obstetrical visit. She is at [redacted]w[redacted]d gestation. Patient reports marked improvement in symptoms. Fetal movement: normal.  Problem List Items Addressed This Visit     High   Supervision of normal first pregnancy - Primary   Relevant Orders      POCT urinalysis dipstick (Completed)      Strep B DNA probe (Completed)   Breech presentation    Other Visit Diagnoses   Need for Tdap vaccination        Relevant Orders       Tdap vaccine greater than or equal to 7yo IM (Completed)      Patient Active Problem List   Diagnosis Date Noted  . Breech presentation 09/06/2013    Priority: High  . Supervision of normal first pregnancy 03/27/2013    Priority: High  . UTI (lower urinary tract infection) 03/01/2013    Priority: High  . Depression     Priority: High  . Hypothyroid 10/17/2006    Priority: High  . Herpes zoster 07/28/2013  . Acute blood loss anemia 04/03/2012  . Tension type headache 03/11/2011  . MOLE 04/21/2006  . GOITER NOS 04/21/2006   Objective:    BP 116/75  Pulse 82  Temp(Src) 97.7 F (36.5 C)  Wt 82.101 kg (181 lb)  LMP 12/26/2012 FHT:  130 BPM  Uterine Size: size equals dates  Presentation: cephalic     Assessment:    Pregnancy @ [redacted]w[redacted]d weeks   Plan:     labs reviewed, problem list updated Orders Placed This Encounter  Procedures  . Strep B DNA probe  . Tdap vaccine greater than or equal to 7yo IM  . POCT urinalysis dipstick  ECV attempt next week  Follow up in 2 weeks.

## 2013-09-06 NOTE — Patient Instructions (Signed)
External Cephalic Version External cephalic version is turning a baby that is presenting his or her buttocks first (breech) or is lying sideways in the uterus (transverse) to a head-first position. This makes the labor and delivery faster, safer for the mother and baby, and lessens the chance for a cesarean section. It should not be tried until the pregnancy is [redacted] weeks along or longer. BEFORE THE PROCEDURE   Do not take aspirin.  Do not eat for 4 hours before the procedure.  Tell your caregiver if you have a cold, fever, or an infection.  Tell your caregiver if you are having contractions.  Tell your caregiver if you are leaking or had a gush of fluid from your vagina.  Tell your caregiver if you have any vaginal bleeding or abnormal discharge.  If you are being admitted the same day, arrive at the hospital at least one hour before the procedure to sign any necessary documents and to get prepared for the procedure.  Tell your caregiver if you had any problems with anesthetics in the past.  Tell your caregiver if you are taking any medications that your caregiver does not know about. This includes over-the-counter and prescription drugs, herbs, eye drops and creams. PROCEDURE  First, an ultrasound is done to make sure the baby is breech or transverse.  A non-stress test or biophysical profile is done on the baby before the ECV. This is done to make sure it is safe for the baby to have the ECV. It may also be done after the procedure to make sure the baby is okay.  ECV is done in the delivery/surgical room with an anesthesiologist present. There should be a setup for an emergency cesarean section with a full nursing and nursery staff available and ready.  The patient may be given a medication to relax the uterine muscles. An epidural may be given for any discomfort. It is helpful for the success of the ECV.  An electronic fetal monitor is placed on the uterus during the procedure to  make sure the baby is okay.  If the mother is Rh-negative, Rho (D) immune globulin will be given to her to prevent Rh problems for future pregnancies.  The mother is followed closely for 2 to 3 hours after the procedure to make sure no problems develop. BENEFITS OF ECV  Easier and safer labor and delivery for the mother and baby.  Lower incidence of cesarean section.  Lower costs with a vaginal delivery. RISKS OF ECV  The placenta pulls away from the wall of the uterus before delivery (abruption of the placenta).  Rupture of the uterus, especially in patients with a previous cesarean section.  Fetal distress.  Early (premature) labor.  Premature rupture of the membranes.  The baby will return to the breech or transverse lie position.  Death of the fetus can happen but is very rare. ECV SHOULD BE STOPPED IF:  The fetal heart tones drop.  The mother is having a lot of pain.  You cannot turn the baby after several attempts. ECV SHOULD NOT BE DONE IF:  The non-stress test or biophysical profile is abnormal.  There is vaginal bleeding.  An abnormal shaped uterus is present.  There is heart disease or uncontrolled high blood pressure in the mother.  There are twins or more.  The placenta covers the opening of the cervix (placenta previa).  You had a previous cesarean section with a classical incision or major surgery of the uterus.  There   is not enough amniotic fluid in the sac (oligohydramnios).  The baby is too small for the pregnancy or has not developed normally (anomaly).  Your membranes have ruptured. HOME CARE INSTRUCTIONS   Have someone take you home after the procedure.  Rest at home for several hours.  Have someone stay with you for a few hours after you get home.  After ECV, continue with your prenatal visits as directed.  Continue your regular diet, rest and activities.  Do not do any strenuous activities for a couple of days. SEEK IMMEDIATE  MEDICAL CARE IF:   You develop vaginal bleeding.  You have fluid coming out of your vagina (bag of water may have broken).  You develop uterine contractions.  You do not feel the baby move or there is less movement of the baby.  You develop abdominal pain.  You develop an oral temperature of 102 F (38.9 C) or higher. Document Released: 07/01/2006 Document Revised: 05/23/2013 Document Reviewed: 04/26/2008 Exit[employer] Patient Information 2015 [employer]. This information is not intended to replace advice given to you by your health care provider. Make sure you discuss any questions you have with your health care provider.  

## 2013-09-06 NOTE — Telephone Encounter (Signed)
Preadmission screen  

## 2013-09-11 ENCOUNTER — Encounter: Payer: Self-pay | Admitting: Obstetrics & Gynecology

## 2013-09-11 DIAGNOSIS — O99820 Streptococcus B carrier state complicating pregnancy: Secondary | ICD-10-CM | POA: Insufficient documentation

## 2013-09-12 ENCOUNTER — Ambulatory Visit (HOSPITAL_COMMUNITY)
Admission: RE | Admit: 2013-09-12 | Discharge: 2013-09-12 | Disposition: A | Discharge status: Home / Self Care | Payer: BC Managed Care – PPO | Source: Ambulatory Visit | Attending: Obstetrics & Gynecology | Admitting: Obstetrics & Gynecology

## 2013-09-12 ENCOUNTER — Inpatient Hospital Stay (HOSPITAL_COMMUNITY): Admission: RE | Admit: 2013-09-12 | Payer: BC Managed Care – PPO | Source: Ambulatory Visit

## 2013-09-12 ENCOUNTER — Encounter: Payer: Self-pay | Admitting: Obstetrics & Gynecology

## 2013-09-12 ENCOUNTER — Ambulatory Visit (INDEPENDENT_AMBULATORY_CARE_PROVIDER_SITE_OTHER): Payer: BC Managed Care – PPO | Admitting: Obstetrics & Gynecology

## 2013-09-12 VITALS — BP 111/70 | HR 92 | Temp 98.6°F | Wt 182.0 lb

## 2013-09-12 DIAGNOSIS — R0602 Shortness of breath: Secondary | ICD-10-CM | POA: Diagnosis present

## 2013-09-12 DIAGNOSIS — Z3403 Encounter for supervision of normal first pregnancy, third trimester: Secondary | ICD-10-CM

## 2013-09-12 DIAGNOSIS — Z3400 Encounter for supervision of normal first pregnancy, unspecified trimester: Secondary | ICD-10-CM

## 2013-09-12 DIAGNOSIS — O99891 Other specified diseases and conditions complicating pregnancy: Secondary | ICD-10-CM | POA: Insufficient documentation

## 2013-09-12 DIAGNOSIS — O9989 Other specified diseases and conditions complicating pregnancy, childbirth and the puerperium: Principal | ICD-10-CM

## 2013-09-12 LAB — CBC
HCT: 37.5 % (ref 36.0–46.0)
HEMOGLOBIN: 12.9 g/dL (ref 12.0–15.0)
MCH: 31.4 pg (ref 26.0–34.0)
MCHC: 34.4 g/dL (ref 30.0–36.0)
MCV: 91.2 fL (ref 78.0–100.0)
PLATELETS: 288 10*3/uL (ref 150–400)
RBC: 4.11 MIL/uL (ref 3.87–5.11)
RDW: 13.4 % (ref 11.5–15.5)
WBC: 8.6 10*3/uL (ref 4.0–10.5)

## 2013-09-12 NOTE — Progress Notes (Deleted)
Patient is experiencing some dizziness, SOB and burning with urination. O2 sat 98

## 2013-09-13 ENCOUNTER — Ambulatory Visit (HOSPITAL_COMMUNITY): Payer: BC Managed Care – PPO

## 2013-09-13 ENCOUNTER — Other Ambulatory Visit: Payer: Self-pay | Admitting: *Deleted

## 2013-09-13 LAB — BASIC METABOLIC PANEL
BUN: 10 mg/dL (ref 6–23)
CALCIUM: 9.4 mg/dL (ref 8.4–10.5)
CO2: 26 meq/L (ref 19–32)
CREATININE: 0.65 mg/dL (ref 0.50–1.10)
Chloride: 105 mEq/L (ref 96–112)
GLUCOSE: 101 mg/dL — AB (ref 70–99)
Potassium: 3.9 mEq/L (ref 3.5–5.3)
Sodium: 140 mEq/L (ref 135–145)

## 2013-09-13 LAB — BRAIN NATRIURETIC PEPTIDE: Brain Natriuretic Peptide: 37 pg/mL (ref 0.0–100.0)

## 2013-09-14 NOTE — Progress Notes (Signed)
Subjective:    Kiara Juarez is a 28 y.o. female being seen today for her obstetrical visit. She is at [redacted]w[redacted]d gestation. Patient reports SOB. Fetal movement: normal.  Problem List Items Addressed This Visit     High   Supervision of normal first pregnancy - Primary   Relevant Orders      POCT urinalysis dipstick      CBC (Completed)      Basic Metabolic Panel (BMET) (Completed)      DG Chest 1 View (Completed)      B Nat Peptide (Completed)    Other Visit Diagnoses   SOB (shortness of breath)        Relevant Orders       CBC (Completed)       Basic Metabolic Panel (BMET) (Completed)       DG Chest 1 View (Completed)       B Nat Peptide (Completed)      Patient Active Problem List   Diagnosis Date Noted  . GBS (group B Streptococcus carrier), +RV culture, currently pregnant 09/11/2013    Priority: High  . Breech presentation 09/06/2013    Priority: High  . Supervision of normal first pregnancy 03/27/2013    Priority: High  . UTI (lower urinary tract infection) 03/01/2013    Priority: High  . Depression     Priority: High  . Hypothyroid 10/17/2006    Priority: High  . Herpes zoster 07/28/2013  . Acute blood loss anemia 04/03/2012  . Tension type headache 03/11/2011  . MOLE 04/21/2006  . GOITER NOS 04/21/2006    Objective:    BP 111/70  Pulse 92  Temp(Src) 98.6 F (37 C)  Wt 82.555 kg (182 lb)  LMP 12/26/2012 FHT: 120 BPM  Uterine Size: size equals dates  Presentations: breech  Pelvic Exam:              Dilation: 1cm       Effacement: Long             Station:  -3    Consistency: medium            Position: posterior     Assessment:    Pregnancy @ [redacted]w[redacted]d weeks  Dyspnea--?sensitive to Progesterone  Plan:   Plans for delivery: C/Section scheduled; labs reviewed; problem list updated Counseling: Consent signed. Infant feeding: plans to breastfeed. Postpartum supports and preparation: circumcision discussed and contraception plans discussed. Orders  Placed This Encounter  Procedures  . DG Chest 1 View    Standing Status: Future     Number of Occurrences: 1     Standing Expiration Date: 11/13/2014    Order Specific Question:  Reason for Exam (SYMPTOM  OR DIAGNOSIS REQUIRED)    Answer:  shortness of breath during pregnancy    Order Specific Question:  Is the patient pregnant?    Answer:  Yes    Order Specific Question:  Preferred imaging location?    Answer:  [facility]  . CBC  . Basic Metabolic Panel (BMET)  . B Nat Peptide  . POCT urinalysis dipstick    Follow up in 1 Week.

## 2013-09-14 NOTE — Patient Instructions (Signed)
Cesarean Delivery  Cesarean delivery is the birth of a baby through a cut (incision) in the abdomen and womb (uterus).  LET YOUR HEALTH CARE PROVIDER KNOW ABOUT:  All medicines you are taking, including vitamins, herbs, eye drops, creams, and over-the-counter medicines.  Previous problems you or members of your family have had with the use of anesthetics.  Any blood disorders you have.  Previous surgeries you have had.  Medical conditions you have.  Any allergies you have.  Complicationsinvolving the pregnancy. RISKS AND COMPLICATIONS  Generally, this is a safe procedure. However, as with any procedure, complications can occur. Possible complications include:  Bleeding.  Infection.  Blood clots.  Injury to surrounding organs.  Problems with anesthesia.  Injury to the baby. BEFORE THE PROCEDURE   You may be given an antacid medicine to drink. This will prevent acid contents in your stomach from going into your lungs if you vomit during the surgery.  You may be given an antibiotic medicine to prevent infection. PROCEDURE   Hair may be removed from your pubic area and your lower abdomen. This is to prevent infection in the incision site.  A tube (Foley catheter) will be placed in your bladder to drain your urine from your bladder into a bag. This keeps your bladder empty during surgery.  An IV tube will be placed in your vein.  You may be given medicine to numb the lower half of your body (regional anesthetic). If you were in labor, you may have already had an epidural in place which can be used in both labor and cesarean delivery. You may possibly be given medicine to make you sleep (general anesthetic) though this is not as common.  An incision will be made in your abdomen that extends to your uterus. There are 2 basic kinds of incisions:  The horizontal (transverse) incision. Horizontal incisions are from side to side and are used for most routine cesarean  deliveries.  The vertical incision. The vertical incision is from the top of the abdomen to the bottom and is less commonly used. It is often done for women who have a serious complication (extreme prematurity) or under emergency situations.  The horizontal and vertical incisions may both be used at the same time. However, this is very uncommon.  An incision is then made in your uterus to deliver the baby.  Your baby will then be delivered.  Both incisions are then closed with absorbable stitches. AFTER THE PROCEDURE   If you were awake during the surgery, you will see your baby right away. If you were asleep, you will see your baby as soon as you are awake.  You may breastfeed your baby after surgery.  You may be able to get up and walk the same day as the surgery. If you need to stay in bed for a period of time, you will receive help to turn, cough, and take deep breaths after surgery. This helps prevent lung problems such as pneumonia.  Do not get out of bed alone the first time after surgery. You will need help getting out of bed until you are able to do this by yourself.  You may be able to shower the day after your cesarean delivery. After the bandage (dressing) is taken off the incision site, a nurse will assist you to shower if you would like help.  You will have pneumatic compression hose placed on your lower legs. This is done to prevent blood clots. When you are up   and walking regularly, they will no longer be necessary.  Do not cross your legs when you sit.  Save any blood clots that you pass. If you pass a clot while on the toilet, do not flush it. Call for the nurse. Tell the nurse if you think you are bleeding too much or passing too many clots.  You will be given medicine as needed. Let your health care providers know if you are hurting. You may also be given an antibiotic to prevent an infection.  Your IV tube will be taken out when you are drinking a reasonable  amount of fluids. The Foley catheter is taken out when you are up and walking.  If your blood type is Rh negative and your baby's blood type is Rh positive, you will be given a shot of anti-D immune globulin. This shot prevents you from having Rh problems with a future pregnancy. You should get the shot even if you had your tubes tied (tubal ligation).  If you are allowed to take the baby for a walk, place the baby in the bassinet and push it. Do not carry your baby in your arms. Document Released: 01/06/2005 Document Revised: 10/27/2012 Document Reviewed: 07/28/2012 [employer] Patient Information 2015 [employer]. This information is not intended to replace advice given to you by your health care provider. Make sure you discuss any questions you have with your health care provider.  

## 2013-09-19 ENCOUNTER — Encounter (HOSPITAL_COMMUNITY): Payer: Self-pay | Admitting: Pharmacist

## 2013-09-19 ENCOUNTER — Ambulatory Visit (INDEPENDENT_AMBULATORY_CARE_PROVIDER_SITE_OTHER): Payer: BC Managed Care – PPO | Admitting: Obstetrics & Gynecology

## 2013-09-19 VITALS — BP 108/68 | HR 71 | Temp 96.7°F | Wt 185.0 lb

## 2013-09-19 DIAGNOSIS — O368131 Decreased fetal movements, third trimester, fetus 1: Secondary | ICD-10-CM

## 2013-09-19 DIAGNOSIS — Z3400 Encounter for supervision of normal first pregnancy, unspecified trimester: Secondary | ICD-10-CM

## 2013-09-19 DIAGNOSIS — O368190 Decreased fetal movements, unspecified trimester, not applicable or unspecified: Secondary | ICD-10-CM

## 2013-09-19 DIAGNOSIS — Z3403 Encounter for supervision of normal first pregnancy, third trimester: Secondary | ICD-10-CM

## 2013-09-19 DIAGNOSIS — Z23 Encounter for immunization: Secondary | ICD-10-CM

## 2013-09-19 LAB — POCT URINALYSIS DIPSTICK
Bilirubin, UA: NEGATIVE
Glucose, UA: NEGATIVE
Ketones, UA: NEGATIVE
Leukocytes, UA: NEGATIVE
Nitrite, UA: NEGATIVE
PH UA: 6
PROTEIN UA: NEGATIVE
RBC UA: NEGATIVE
SPEC GRAV UA: 1.015
Urobilinogen, UA: NEGATIVE

## 2013-09-19 NOTE — Patient Instructions (Signed)
Fetal Movement Counts Patient Name: __________________________________________________ Patient Due Date: ____________________ Performing a fetal movement count is highly recommended in high-risk pregnancies, but it is good for every pregnant woman to do. Your health care provider may ask you to start counting fetal movements at 28 weeks of the pregnancy. Fetal movements often increase:  After eating a full meal.  After physical activity.  After eating or drinking something sweet or cold.  At rest. Pay attention to when you feel the baby is most active. This will help you notice a pattern of your baby's sleep and wake cycles and what factors contribute to an increase in fetal movement. It is important to perform a fetal movement count at the same time each day when your baby is normally most active.  HOW TO COUNT FETAL MOVEMENTS 1. Find a quiet and comfortable area to sit or lie down on your left side. Lying on your left side provides the best blood and oxygen circulation to your baby. 2. Write down the day and time on a sheet of paper or in a journal. 3. Start counting kicks, flutters, swishes, rolls, or jabs in a 2-hour period. You should feel at least 10 movements within 2 hours. 4. If you do not feel 10 movements in 2 hours, wait 2-3 hours and count again. Look for a change in the pattern or not enough counts in 2 hours. SEEK MEDICAL CARE IF:  You feel less than 10 counts in 2 hours, tried twice.  There is no movement in over an hour.  The pattern is changing or taking longer each day to reach 10 counts in 2 hours.  You feel the baby is not moving as he or she usually does. Date: ____________ Movements: ____________ Start time: ____________ Kiara Juarez time: ____________  Date: ____________ Movements: ____________ Start time: ____________ Kiara Juarez time: ____________ Date: ____________ Movements: ____________ Start time: ____________ Kiara Juarez time: ____________ Date: ____________ Movements:  ____________ Start time: ____________ Kiara Juarez time: ____________ Date: ____________ Movements: ____________ Start time: ____________ Kiara Juarez time: ____________ Date: ____________ Movements: ____________ Start time: ____________ Kiara Juarez time: ____________ Date: ____________ Movements: ____________ Start time: ____________ Kiara Juarez time: ____________ Date: ____________ Movements: ____________ Start time: ____________ Kiara Juarez time: ____________  Date: ____________ Movements: ____________ Start time: ____________ Kiara Juarez time: ____________ Date: ____________ Movements: ____________ Start time: ____________ Kiara Juarez time: ____________ Date: ____________ Movements: ____________ Start time: ____________ Kiara Juarez time: ____________ Date: ____________ Movements: ____________ Start time: ____________ Kiara Juarez time: ____________ Date: ____________ Movements: ____________ Start time: ____________ Kiara Juarez time: ____________ Date: ____________ Movements: ____________ Start time: ____________ Kiara Juarez time: ____________ Date: ____________ Movements: ____________ Start time: ____________ Kiara Juarez time: ____________  Date: ____________ Movements: ____________ Start time: ____________ Kiara Juarez time: ____________ Date: ____________ Movements: ____________ Start time: ____________ Kiara Juarez time: ____________ Date: ____________ Movements: ____________ Start time: ____________ Kiara Juarez time: ____________ Date: ____________ Movements: ____________ Start time: ____________ Kiara Juarez time: ____________ Date: ____________ Movements: ____________ Start time: ____________ Kiara Juarez time: ____________ Date: ____________ Movements: ____________ Start time: ____________ Kiara Juarez time: ____________ Date: ____________ Movements: ____________ Start time: ____________ Kiara Juarez time: ____________  Date: ____________ Movements: ____________ Start time: ____________ Kiara Juarez time: ____________ Date: ____________ Movements: ____________ Start time: ____________ Kiara Juarez  time: ____________ Date: ____________ Movements: ____________ Start time: ____________ Kiara Juarez time: ____________ Date: ____________ Movements: ____________ Start time: ____________ Kiara Juarez time: ____________ Date: ____________ Movements: ____________ Start time: ____________ Kiara Juarez time: ____________ Date: ____________ Movements: ____________ Start time: ____________ Kiara Juarez time: ____________ Date: ____________ Movements: ____________ Start time: ____________ Kiara Juarez time: ____________  Date: ____________ Movements: ____________ Start time: ____________ Kiara Juarez  time: ____________ Date: ____________ Movements: ____________ Start time: ____________ Kiara Juarez time: ____________ Date: ____________ Movements: ____________ Start time: ____________ Kiara Juarez time: ____________ Date: ____________ Movements: ____________ Start time: ____________ Kiara Juarez time: ____________ Date: ____________ Movements: ____________ Start time: ____________ Kiara Juarez time: ____________ Date: ____________ Movements: ____________ Start time: ____________ Kiara Juarez time: ____________ Date: ____________ Movements: ____________ Start time: ____________ Kiara Juarez time: ____________  Date: ____________ Movements: ____________ Start time: ____________ Kiara Juarez time: ____________ Date: ____________ Movements: ____________ Start time: ____________ Kiara Juarez time: ____________ Date: ____________ Movements: ____________ Start time: ____________ Kiara Juarez time: ____________ Date: ____________ Movements: ____________ Start time: ____________ Kiara Juarez time: ____________ Date: ____________ Movements: ____________ Start time: ____________ Kiara Juarez time: ____________ Date: ____________ Movements: ____________ Start time: ____________ Kiara Juarez time: ____________ Date: ____________ Movements: ____________ Start time: ____________ Kiara Juarez time: ____________  Date: ____________ Movements: ____________ Start time: ____________ Kiara Juarez time: ____________ Date: ____________  Movements: ____________ Start time: ____________ Kiara Juarez time: ____________ Date: ____________ Movements: ____________ Start time: ____________ Kiara Juarez time: ____________ Date: ____________ Movements: ____________ Start time: ____________ Kiara Juarez time: ____________ Date: ____________ Movements: ____________ Start time: ____________ Kiara Juarez time: ____________ Date: ____________ Movements: ____________ Start time: ____________ Kiara Juarez time: ____________ Date: ____________ Movements: ____________ Start time: ____________ Kiara Juarez time: ____________  Date: ____________ Movements: ____________ Start time: ____________ Kiara Juarez time: ____________ Date: ____________ Movements: ____________ Start time: ____________ Kiara Juarez time: ____________ Date: ____________ Movements: ____________ Start time: ____________ Kiara Juarez time: ____________ Date: ____________ Movements: ____________ Start time: ____________ Kiara Juarez time: ____________ Date: ____________ Movements: ____________ Start time: ____________ Kiara Juarez time: ____________ Date: ____________ Movements: ____________ Start time: ____________ Kiara Juarez time: ____________ Document Released: 02/05/2006 Document Revised: 05/23/2013 Document Reviewed: 11/03/2011 Exit[employer] Patient Information 2015 [employer]. This information is not intended to replace advice given to you by your health care provider. Make sure you discuss any questions you have with your health care provider. Fetal Movement Counts Patient Name: __________________________________________________ Patient Due Date: ____________________ Performing a fetal movement count is highly recommended in high-risk pregnancies, but it is good for every pregnant woman to do. Your health care provider may ask you to start counting fetal movements at 28 weeks of the pregnancy. Fetal movements often increase:  After eating a full meal.  After physical activity.  After eating or drinking something sweet or cold.  At  rest. Pay attention to when you feel the baby is most active. This will help you notice a pattern of your baby's sleep and wake cycles and what factors contribute to an increase in fetal movement. It is important to perform a fetal movement count at the same time each day when your baby is normally most active.  HOW TO COUNT FETAL MOVEMENTS 5. Find a quiet and comfortable area to sit or lie down on your left side. Lying on your left side provides the best blood and oxygen circulation to your baby. 6. Write down the day and time on a sheet of paper or in a journal. 7. Start counting kicks, flutters, swishes, rolls, or jabs in a 2-hour period. You should feel at least 10 movements within 2 hours. 8. If you do not feel 10 movements in 2 hours, wait 2-3 hours and count again. Look for a change in the pattern or not enough counts in 2 hours. SEEK MEDICAL CARE IF:  You feel less than 10 counts in 2 hours, tried twice.  There is no movement in over an hour.  The pattern is changing or taking longer each day to reach 10 counts in 2 hours.  You feel the baby is not moving  as he or she usually does. Date: ____________ Movements: ____________ Start time: ____________ Kiara Juarez time: ____________  Date: ____________ Movements: ____________ Start time: ____________ Kiara Juarez time: ____________ Date: ____________ Movements: ____________ Start time: ____________ Kiara Juarez time: ____________ Date: ____________ Movements: ____________ Start time: ____________ Kiara Juarez time: ____________ Date: ____________ Movements: ____________ Start time: ____________ Kiara Juarez time: ____________ Date: ____________ Movements: ____________ Start time: ____________ Kiara Juarez time: ____________ Date: ____________ Movements: ____________ Start time: ____________ Kiara Juarez time: ____________ Date: ____________ Movements: ____________ Start time: ____________ Kiara Juarez time: ____________  Date: ____________ Movements: ____________ Start time:  ____________ Kiara Juarez time: ____________ Date: ____________ Movements: ____________ Start time: ____________ Kiara Juarez time: ____________ Date: ____________ Movements: ____________ Start time: ____________ Kiara Juarez time: ____________ Date: ____________ Movements: ____________ Start time: ____________ Kiara Juarez time: ____________ Date: ____________ Movements: ____________ Start time: ____________ Kiara Juarez time: ____________ Date: ____________ Movements: ____________ Start time: ____________ Kiara Juarez time: ____________ Date: ____________ Movements: ____________ Start time: ____________ Kiara Juarez time: ____________  Date: ____________ Movements: ____________ Start time: ____________ Kiara Juarez time: ____________ Date: ____________ Movements: ____________ Start time: ____________ Kiara Juarez time: ____________ Date: ____________ Movements: ____________ Start time: ____________ Kiara Juarez time: ____________ Date: ____________ Movements: ____________ Start time: ____________ Kiara Juarez time: ____________ Date: ____________ Movements: ____________ Start time: ____________ Kiara Juarez time: ____________ Date: ____________ Movements: ____________ Start time: ____________ Kiara Juarez time: ____________ Date: ____________ Movements: ____________ Start time: ____________ Kiara Juarez time: ____________  Date: ____________ Movements: ____________ Start time: ____________ Kiara Juarez time: ____________ Date: ____________ Movements: ____________ Start time: ____________ Kiara Juarez time: ____________ Date: ____________ Movements: ____________ Start time: ____________ Kiara Juarez time: ____________ Date: ____________ Movements: ____________ Start time: ____________ Kiara Juarez time: ____________ Date: ____________ Movements: ____________ Start time: ____________ Kiara Juarez time: ____________ Date: ____________ Movements: ____________ Start time: ____________ Kiara Juarez time: ____________ Date: ____________ Movements: ____________ Start time: ____________ Kiara Juarez time: ____________  Date:  ____________ Movements: ____________ Start time: ____________ Kiara Juarez time: ____________ Date: ____________ Movements: ____________ Start time: ____________ Kiara Juarez time: ____________ Date: ____________ Movements: ____________ Start time: ____________ Kiara Juarez time: ____________ Date: ____________ Movements: ____________ Start time: ____________ Kiara Juarez time: ____________ Date: ____________ Movements: ____________ Start time: ____________ Kiara Juarez time: ____________ Date: ____________ Movements: ____________ Start time: ____________ Kiara Juarez time: ____________ Date: ____________ Movements: ____________ Start time: ____________ Kiara Juarez time: ____________  Date: ____________ Movements: ____________ Start time: ____________ Kiara Juarez time: ____________ Date: ____________ Movements: ____________ Start time: ____________ Kiara Juarez time: ____________ Date: ____________ Movements: ____________ Start time: ____________ Kiara Juarez time: ____________ Date: ____________ Movements: ____________ Start time: ____________ Kiara Juarez time: ____________ Date: ____________ Movements: ____________ Start time: ____________ Kiara Juarez time: ____________ Date: ____________ Movements: ____________ Start time: ____________ Kiara Juarez time: ____________ Date: ____________ Movements: ____________ Start time: ____________ Kiara Juarez time: ____________  Date: ____________ Movements: ____________ Start time: ____________ Kiara Juarez time: ____________ Date: ____________ Movements: ____________ Start time: ____________ Kiara Juarez time: ____________ Date: ____________ Movements: ____________ Start time: ____________ Kiara Juarez time: ____________ Date: ____________ Movements: ____________ Start time: ____________ Kiara Juarez time: ____________ Date: ____________ Movements: ____________ Start time: ____________ Kiara Juarez time: ____________ Date: ____________ Movements: ____________ Start time: ____________ Kiara Juarez time: ____________ Date: ____________ Movements: ____________ Start  time: ____________ Kiara Juarez time: ____________  Date: ____________ Movements: ____________ Start time: ____________ Kiara Juarez time: ____________ Date: ____________ Movements: ____________ Start time: ____________ Kiara Juarez time: ____________ Date: ____________ Movements: ____________ Start time: ____________ Kiara Juarez time: ____________ Date: ____________ Movements: ____________ Start time: ____________ Kiara Juarez time: ____________ Date: ____________ Movements: ____________ Start time: ____________ Kiara Juarez time: ____________ Date: ____________ Movements: ____________ Start time: ____________ Kiara Juarez time: ____________ Document Released: 02/05/2006 Document Revised: 05/23/2013 Document Reviewed: 11/03/2011 ExitCare Patient Information 2015 [employer]. This information is not intended to replace advice  given to you by your health care provider. Make sure you discuss any questions you have with your health care provider. Fetal Movement Counts Patient Name: __________________________________________________ Patient Due Date: ____________________ Performing a fetal movement count is highly recommended in high-risk pregnancies, but it is good for every pregnant woman to do. Your health care provider may ask you to start counting fetal movements at 28 weeks of the pregnancy. Fetal movements often increase:  After eating a full meal.  After physical activity.  After eating or drinking something sweet or cold.  At rest. Pay attention to when you feel the baby is most active. This will help you notice a pattern of your baby's sleep and wake cycles and what factors contribute to an increase in fetal movement. It is important to perform a fetal movement count at the same time each day when your baby is normally most active.  HOW TO COUNT FETAL MOVEMENTS 9. Find a quiet and comfortable area to sit or lie down on your left side. Lying on your left side provides the best blood and oxygen circulation to your  baby. 10. Write down the day and time on a sheet of paper or in a journal. 11. Start counting kicks, flutters, swishes, rolls, or jabs in a 2-hour period. You should feel at least 10 movements within 2 hours. 12. If you do not feel 10 movements in 2 hours, wait 2-3 hours and count again. Look for a change in the pattern or not enough counts in 2 hours. SEEK MEDICAL CARE IF:  You feel less than 10 counts in 2 hours, tried twice.  There is no movement in over an hour.  The pattern is changing or taking longer each day to reach 10 counts in 2 hours.  You feel the baby is not moving as he or she usually does. Date: ____________ Movements: ____________ Start time: ____________ Kiara Juarez time: ____________  Date: ____________ Movements: ____________ Start time: ____________ Kiara Juarez time: ____________ Date: ____________ Movements: ____________ Start time: ____________ Kiara Juarez time: ____________ Date: ____________ Movements: ____________ Start time: ____________ Kiara Juarez time: ____________ Date: ____________ Movements: ____________ Start time: ____________ Kiara Juarez time: ____________ Date: ____________ Movements: ____________ Start time: ____________ Kiara Juarez time: ____________ Date: ____________ Movements: ____________ Start time: ____________ Kiara Juarez time: ____________ Date: ____________ Movements: ____________ Start time: ____________ Kiara Juarez time: ____________  Date: ____________ Movements: ____________ Start time: ____________ Kiara Juarez time: ____________ Date: ____________ Movements: ____________ Start time: ____________ Kiara Juarez time: ____________ Date: ____________ Movements: ____________ Start time: ____________ Kiara Juarez time: ____________ Date: ____________ Movements: ____________ Start time: ____________ Kiara Juarez time: ____________ Date: ____________ Movements: ____________ Start time: ____________ Kiara Juarez time: ____________ Date: ____________ Movements: ____________ Start time: ____________ Kiara Juarez time:  ____________ Date: ____________ Movements: ____________ Start time: ____________ Kiara Juarez time: ____________  Date: ____________ Movements: ____________ Start time: ____________ Kiara Juarez time: ____________ Date: ____________ Movements: ____________ Start time: ____________ Kiara Juarez time: ____________ Date: ____________ Movements: ____________ Start time: ____________ Kiara Juarez time: ____________ Date: ____________ Movements: ____________ Start time: ____________ Kiara Juarez time: ____________ Date: ____________ Movements: ____________ Start time: ____________ Kiara Juarez time: ____________ Date: ____________ Movements: ____________ Start time: ____________ Kiara Juarez time: ____________ Date: ____________ Movements: ____________ Start time: ____________ Kiara Juarez time: ____________  Date: ____________ Movements: ____________ Start time: ____________ Kiara Juarez time: ____________ Date: ____________ Movements: ____________ Start time: ____________ Kiara Juarez time: ____________ Date: ____________ Movements: ____________ Start time: ____________ Kiara Juarez time: ____________ Date: ____________ Movements: ____________ Start time: ____________ Kiara Juarez time: ____________ Date: ____________ Movements: ____________ Start time: ____________ Kiara Juarez time: ____________ Date: ____________ Movements: ____________ Start time: ____________ Kiara Juarez  time: ____________ Date: ____________ Movements: ____________ Start time: ____________ Kiara Juarez time: ____________  Date: ____________ Movements: ____________ Start time: ____________ Kiara Juarez time: ____________ Date: ____________ Movements: ____________ Start time: ____________ Kiara Juarez time: ____________ Date: ____________ Movements: ____________ Start time: ____________ Kiara Juarez time: ____________ Date: ____________ Movements: ____________ Start time: ____________ Kiara Juarez time: ____________ Date: ____________ Movements: ____________ Start time: ____________ Kiara Juarez time: ____________ Date: ____________ Movements:  ____________ Start time: ____________ Kiara Juarez time: ____________ Date: ____________ Movements: ____________ Start time: ____________ Kiara Juarez time: ____________  Date: ____________ Movements: ____________ Start time: ____________ Kiara Juarez time: ____________ Date: ____________ Movements: ____________ Start time: ____________ Kiara Juarez time: ____________ Date: ____________ Movements: ____________ Start time: ____________ Kiara Juarez time: ____________ Date: ____________ Movements: ____________ Start time: ____________ Kiara Juarez time: ____________ Date: ____________ Movements: ____________ Start time: ____________ Kiara Juarez time: ____________ Date: ____________ Movements: ____________ Start time: ____________ Kiara Juarez time: ____________ Date: ____________ Movements: ____________ Start time: ____________ Kiara Juarez time: ____________  Date: ____________ Movements: ____________ Start time: ____________ Kiara Juarez time: ____________ Date: ____________ Movements: ____________ Start time: ____________ Kiara Juarez time: ____________ Date: ____________ Movements: ____________ Start time: ____________ Kiara Juarez time: ____________ Date: ____________ Movements: ____________ Start time: ____________ Kiara Juarez time: ____________ Date: ____________ Movements: ____________ Start time: ____________ Kiara Juarez time: ____________ Date: ____________ Movements: ____________ Start time: ____________ Kiara Juarez time: ____________ Date: ____________ Movements: ____________ Start time: ____________ Kiara Juarez time: ____________  Date: ____________ Movements: ____________ Start time: ____________ Kiara Juarez time: ____________ Date: ____________ Movements: ____________ Start time: ____________ Kiara Juarez time: ____________ Date: ____________ Movements: ____________ Start time: ____________ Kiara Juarez time: ____________ Date: ____________ Movements: ____________ Start time: ____________ Kiara Juarez time: ____________ Date: ____________ Movements: ____________ Start time: ____________ Kiara Juarez  time: ____________ Date: ____________ Movements: ____________ Start time: ____________ Kiara Juarez time: ____________ Document Released: 02/05/2006 Document Revised: 05/23/2013 Document Reviewed: 11/03/2011 Exit[employer] Patient Information 2015 [employer]. This information is not intended to replace advice given to you by your health care provider. Make sure you discuss any questions you have with your health care provider. Fetal Movement Counts Patient Name: __________________________________________________ Patient Due Date: ____________________ Performing a fetal movement count is highly recommended in high-risk pregnancies, but it is good for every pregnant woman to do. Your health care provider may ask you to start counting fetal movements at 28 weeks of the pregnancy. Fetal movements often increase:  After eating a full meal.  After physical activity.  After eating or drinking something sweet or cold.  At rest. Pay attention to when you feel the baby is most active. This will help you notice a pattern of your baby's sleep and wake cycles and what factors contribute to an increase in fetal movement. It is important to perform a fetal movement count at the same time each day when your baby is normally most active.  HOW TO COUNT FETAL MOVEMENTS 13. Find a quiet and comfortable area to sit or lie down on your left side. Lying on your left side provides the best blood and oxygen circulation to your baby. 14. Write down the day and time on a sheet of paper or in a journal. 15. Start counting kicks, flutters, swishes, rolls, or jabs in a 2-hour period. You should feel at least 10 movements within 2 hours. 16. If you do not feel 10 movements in 2 hours, wait 2-3 hours and count again. Look for a change in the pattern or not enough counts in 2 hours. SEEK MEDICAL CARE IF:  You feel less than 10 counts in 2 hours, tried twice.  There is no movement in over an hour.  The pattern is  changing or  taking longer each day to reach 10 counts in 2 hours.  You feel the baby is not moving as he or she usually does. Date: ____________ Movements: ____________ Start time: ____________ Kiara Juarez time: ____________  Date: ____________ Movements: ____________ Start time: ____________ Kiara Juarez time: ____________ Date: ____________ Movements: ____________ Start time: ____________ Kiara Juarez time: ____________ Date: ____________ Movements: ____________ Start time: ____________ Kiara Juarez time: ____________ Date: ____________ Movements: ____________ Start time: ____________ Kiara Juarez time: ____________ Date: ____________ Movements: ____________ Start time: ____________ Kiara Juarez time: ____________ Date: ____________ Movements: ____________ Start time: ____________ Kiara Juarez time: ____________ Date: ____________ Movements: ____________ Start time: ____________ Kiara Juarez time: ____________  Date: ____________ Movements: ____________ Start time: ____________ Kiara Juarez time: ____________ Date: ____________ Movements: ____________ Start time: ____________ Kiara Juarez time: ____________ Date: ____________ Movements: ____________ Start time: ____________ Kiara Juarez time: ____________ Date: ____________ Movements: ____________ Start time: ____________ Kiara Juarez time: ____________ Date: ____________ Movements: ____________ Start time: ____________ Kiara Juarez time: ____________ Date: ____________ Movements: ____________ Start time: ____________ Kiara Juarez time: ____________ Date: ____________ Movements: ____________ Start time: ____________ Kiara Juarez time: ____________  Date: ____________ Movements: ____________ Start time: ____________ Kiara Juarez time: ____________ Date: ____________ Movements: ____________ Start time: ____________ Kiara Juarez time: ____________ Date: ____________ Movements: ____________ Start time: ____________ Kiara Juarez time: ____________ Date: ____________ Movements: ____________ Start time: ____________ Kiara Juarez time: ____________ Date: ____________  Movements: ____________ Start time: ____________ Kiara Juarez time: ____________ Date: ____________ Movements: ____________ Start time: ____________ Kiara Juarez time: ____________ Date: ____________ Movements: ____________ Start time: ____________ Kiara Juarez time: ____________  Date: ____________ Movements: ____________ Start time: ____________ Kiara Juarez time: ____________ Date: ____________ Movements: ____________ Start time: ____________ Kiara Juarez time: ____________ Date: ____________ Movements: ____________ Start time: ____________ Kiara Juarez time: ____________ Date: ____________ Movements: ____________ Start time: ____________ Kiara Juarez time: ____________ Date: ____________ Movements: ____________ Start time: ____________ Kiara Juarez time: ____________ Date: ____________ Movements: ____________ Start time: ____________ Kiara Juarez time: ____________ Date: ____________ Movements: ____________ Start time: ____________ Kiara Juarez time: ____________  Date: ____________ Movements: ____________ Start time: ____________ Kiara Juarez time: ____________ Date: ____________ Movements: ____________ Start time: ____________ Kiara Juarez time: ____________ Date: ____________ Movements: ____________ Start time: ____________ Kiara Juarez time: ____________ Date: ____________ Movements: ____________ Start time: ____________ Kiara Juarez time: ____________ Date: ____________ Movements: ____________ Start time: ____________ Kiara Juarez time: ____________ Date: ____________ Movements: ____________ Start time: ____________ Kiara Juarez time: ____________ Date: ____________ Movements: ____________ Start time: ____________ Kiara Juarez time: ____________  Date: ____________ Movements: ____________ Start time: ____________ Kiara Juarez time: ____________ Date: ____________ Movements: ____________ Start time: ____________ Kiara Juarez time: ____________ Date: ____________ Movements: ____________ Start time: ____________ Kiara Juarez time: ____________ Date: ____________ Movements: ____________ Start time:  ____________ Kiara Juarez time: ____________ Date: ____________ Movements: ____________ Start time: ____________ Kiara Juarez time: ____________ Date: ____________ Movements: ____________ Start time: ____________ Kiara Juarez time: ____________ Date: ____________ Movements: ____________ Start time: ____________ Kiara Juarez time: ____________  Date: ____________ Movements: ____________ Start time: ____________ Kiara Juarez time: ____________ Date: ____________ Movements: ____________ Start time: ____________ Kiara Juarez time: ____________ Date: ____________ Movements: ____________ Start time: ____________ Kiara Juarez time: ____________ Date: ____________ Movements: ____________ Start time: ____________ Kiara Juarez time: ____________ Date: ____________ Movements: ____________ Start time: ____________ Kiara Juarez time: ____________ Date: ____________ Movements: ____________ Start time: ____________ Kiara Juarez time: ____________ Date: ____________ Movements: ____________ Start time: ____________ Kiara Juarez time: ____________  Date: ____________ Movements: ____________ Start time: ____________ Kiara Juarez time: ____________ Date: ____________ Movements: ____________ Start time: ____________ Kiara Juarez time: ____________ Date: ____________ Movements: ____________ Start time: ____________ Kiara Juarez time: ____________ Date: ____________ Movements: ____________ Start time: ____________ Kiara Juarez time: ____________ Date: ____________ Movements: ____________ Start time: ____________ Kiara Juarez time: ____________ Date: ____________ Movements: ____________ Start time: ____________ Kiara Juarez time: ____________ Document Released:  02/05/2006 Document Revised: 05/23/2013 Document Reviewed: 11/03/2011 ExitCare Patient Information 2015 [place], [place]. This information is not intended to replace advice given to you by your health care provider. Make sure you discuss any questions you have with your health care provider.

## 2013-09-27 ENCOUNTER — Encounter (HOSPITAL_COMMUNITY): Payer: Self-pay

## 2013-09-27 ENCOUNTER — Encounter (HOSPITAL_COMMUNITY)
Admission: RE | Admit: 2013-09-27 | Discharge: 2013-09-27 | Disposition: A | Discharge status: Home / Self Care | Payer: BC Managed Care – PPO | Source: Ambulatory Visit | Attending: Obstetrics | Admitting: Obstetrics

## 2013-09-27 VITALS — BP 102/70 | HR 82 | Temp 97.7°F | Resp 20 | Ht 63.0 in | Wt 184.0 lb

## 2013-09-27 DIAGNOSIS — O99820 Streptococcus B carrier state complicating pregnancy: Secondary | ICD-10-CM

## 2013-09-27 DIAGNOSIS — B029 Zoster without complications: Secondary | ICD-10-CM

## 2013-09-27 DIAGNOSIS — N390 Urinary tract infection, site not specified: Secondary | ICD-10-CM

## 2013-09-27 DIAGNOSIS — O321XX1 Maternal care for breech presentation, fetus 1: Secondary | ICD-10-CM

## 2013-09-27 LAB — CBC
HCT: 38.7 % (ref 36.0–46.0)
Hemoglobin: 13.4 g/dL (ref 12.0–15.0)
MCH: 32.1 pg (ref 26.0–34.0)
MCHC: 34.6 g/dL (ref 30.0–36.0)
MCV: 92.6 fL (ref 78.0–100.0)
PLATELETS: 275 10*3/uL (ref 150–400)
RBC: 4.18 MIL/uL (ref 3.87–5.11)
RDW: 13 % (ref 11.5–15.5)
WBC: 8.3 10*3/uL (ref 4.0–10.5)

## 2013-09-27 LAB — RPR

## 2013-09-27 MED ORDER — DEXTROSE 5 % IV SOLN
500.0000 mg | INTRAVENOUS | Status: AC
  Administered 2013-09-28: 500 mg via INTRAVENOUS
  Filled 2013-09-27: qty 500

## 2013-09-27 NOTE — Patient Instructions (Signed)
   Your procedure is scheduled on: SEPT 9 AT 2:45PM  Enter through the Main Entrance of [facility] at: 1:15PM SEPT 9 Pick up the phone at the desk and dial [phone] and inform us of your arrival.  Please call this number if you have any problems the morning of surgery: [phone]  Remember: Do not eat food after midnight:SEPT 8 Do not drink clear liquids after: 11AM ON SEPT 9 Take these medicines the morning of surgery with a SIP OF WATER:  Do not wear jewelry, make-up, or FINGER nail polish No metal in your hair or on your body. Do not wear lotions, powders, perfumes.  You may wear deodorant.  Do not bring valuables to the hospital. Contacts, dentures or bridgework may not be worn into surgery.  Leave suitcase in the car. After Surgery it may be brought to your room. For patients being admitted to the hospital, checkout time is 11:00am the day of discharge.    Patients discharged on the day of surgery will not be allowed to drive home.

## 2013-09-28 ENCOUNTER — Inpatient Hospital Stay (HOSPITAL_COMMUNITY)
Admission: AD | Admit: 2013-09-28 | Discharge: 2013-10-01 | DRG: 766 | Disposition: A | Discharge status: Home / Self Care | Payer: BC Managed Care – PPO | Source: Ambulatory Visit | Attending: Obstetrics & Gynecology | Admitting: Obstetrics & Gynecology

## 2013-09-28 ENCOUNTER — Encounter (HOSPITAL_COMMUNITY)
Admission: AD | Disposition: A | Discharge status: Home / Self Care | Payer: Self-pay | Source: Ambulatory Visit | Attending: Obstetrics & Gynecology

## 2013-09-28 ENCOUNTER — Encounter (HOSPITAL_COMMUNITY): Payer: Self-pay | Admitting: *Deleted

## 2013-09-28 ENCOUNTER — Inpatient Hospital Stay (HOSPITAL_COMMUNITY): Payer: BC Managed Care – PPO | Admitting: Anesthesiology

## 2013-09-28 ENCOUNTER — Encounter (HOSPITAL_COMMUNITY): Payer: BC Managed Care – PPO | Admitting: Anesthesiology

## 2013-09-28 DIAGNOSIS — B029 Zoster without complications: Secondary | ICD-10-CM

## 2013-09-28 DIAGNOSIS — E079 Disorder of thyroid, unspecified: Secondary | ICD-10-CM | POA: Diagnosis present

## 2013-09-28 DIAGNOSIS — Z833 Family history of diabetes mellitus: Secondary | ICD-10-CM | POA: Diagnosis not present

## 2013-09-28 DIAGNOSIS — O99820 Streptococcus B carrier state complicating pregnancy: Secondary | ICD-10-CM

## 2013-09-28 DIAGNOSIS — E039 Hypothyroidism, unspecified: Secondary | ICD-10-CM | POA: Diagnosis present

## 2013-09-28 DIAGNOSIS — Z825 Family history of asthma and other chronic lower respiratory diseases: Secondary | ICD-10-CM

## 2013-09-28 DIAGNOSIS — Z803 Family history of malignant neoplasm of breast: Secondary | ICD-10-CM | POA: Diagnosis not present

## 2013-09-28 DIAGNOSIS — Z8261 Family history of arthritis: Secondary | ICD-10-CM | POA: Diagnosis not present

## 2013-09-28 DIAGNOSIS — Z98891 History of uterine scar from previous surgery: Secondary | ICD-10-CM

## 2013-09-28 DIAGNOSIS — O321XX0 Maternal care for breech presentation, not applicable or unspecified: Secondary | ICD-10-CM | POA: Diagnosis present

## 2013-09-28 DIAGNOSIS — Z87891 Personal history of nicotine dependence: Secondary | ICD-10-CM

## 2013-09-28 DIAGNOSIS — O99284 Endocrine, nutritional and metabolic diseases complicating childbirth: Secondary | ICD-10-CM

## 2013-09-28 DIAGNOSIS — N390 Urinary tract infection, site not specified: Secondary | ICD-10-CM

## 2013-09-28 SURGERY — Surgical Case
Anesthesia: Spinal | Site: Abdomen

## 2013-09-28 MED ORDER — BUPIVACAINE IN DEXTROSE 0.75-8.25 % IT SOLN
INTRATHECAL | Status: DC | PRN
  Administered 2013-09-28: 1.4 mL via INTRATHECAL

## 2013-09-28 MED ORDER — SENNOSIDES-DOCUSATE SODIUM 8.6-50 MG PO TABS
2.0000 | ORAL_TABLET | ORAL | Status: DC
  Administered 2013-09-29 – 2013-09-30 (×3): 2 via ORAL
  Filled 2013-09-28 (×3): qty 2

## 2013-09-28 MED ORDER — ONDANSETRON HCL 4 MG/2ML IJ SOLN: 4.0000 mg | Freq: Three times a day (TID) | INTRAMUSCULAR | Status: DC | PRN

## 2013-09-28 MED ORDER — CEFAZOLIN SODIUM-DEXTROSE 2-3 GM-% IV SOLR
INTRAVENOUS | Status: AC
  Administered 2013-09-28: 2 g via INTRAVENOUS
  Filled 2013-09-28: qty 50

## 2013-09-28 MED ORDER — PHENYLEPHRINE 8 MG IN D5W 100 ML (0.08MG/ML) PREMIX OPTIME
INJECTION | INTRAVENOUS | Status: AC
  Filled 2013-09-28: qty 100

## 2013-09-28 MED ORDER — SODIUM CHLORIDE 0.9 % IJ SOLN: 3.0000 mL | INTRAMUSCULAR | Status: DC | PRN

## 2013-09-28 MED ORDER — WITCH HAZEL-GLYCERIN EX PADS: 1.0000 "application " | MEDICATED_PAD | CUTANEOUS | Status: DC | PRN

## 2013-09-28 MED ORDER — IBUPROFEN 600 MG PO TABS
600.0000 mg | ORAL_TABLET | Freq: Four times a day (QID) | ORAL | Status: DC
  Administered 2013-09-29 – 2013-10-01 (×10): 600 mg via ORAL
  Filled 2013-09-28 (×11): qty 1

## 2013-09-28 MED ORDER — OXYCODONE-ACETAMINOPHEN 5-325 MG PO TABS
1.0000 | ORAL_TABLET | ORAL | Status: DC | PRN
  Administered 2013-09-29 – 2013-10-01 (×8): 1 via ORAL
  Filled 2013-09-28 (×10): qty 1

## 2013-09-28 MED ORDER — ONDANSETRON HCL 4 MG PO TABS: 4.0000 mg | ORAL_TABLET | ORAL | Status: DC | PRN

## 2013-09-28 MED ORDER — KETOROLAC TROMETHAMINE 30 MG/ML IJ SOLN
INTRAMUSCULAR | Status: AC
  Administered 2013-09-28: 30 mg via INTRAVENOUS
  Filled 2013-09-28: qty 1

## 2013-09-28 MED ORDER — DIBUCAINE 1 % RE OINT: 1.0000 "application " | TOPICAL_OINTMENT | RECTAL | Status: DC | PRN

## 2013-09-28 MED ORDER — LANOLIN HYDROUS EX OINT: 1.0000 "application " | TOPICAL_OINTMENT | CUTANEOUS | Status: DC | PRN

## 2013-09-28 MED ORDER — SIMETHICONE 80 MG PO CHEW
80.0000 mg | CHEWABLE_TABLET | ORAL | Status: DC
  Administered 2013-09-29 – 2013-09-30 (×3): 80 mg via ORAL
  Filled 2013-09-28 (×3): qty 1

## 2013-09-28 MED ORDER — MENTHOL 3 MG MT LOZG: 1.0000 | LOZENGE | OROMUCOSAL | Status: DC | PRN

## 2013-09-28 MED ORDER — HYDROMORPHONE HCL PF 1 MG/ML IJ SOLN
0.2500 mg | INTRAMUSCULAR | Status: DC | PRN
  Administered 2013-09-28 (×2): 0.5 mg via INTRAVENOUS

## 2013-09-28 MED ORDER — IBUPROFEN 600 MG PO TABS
600.0000 mg | ORAL_TABLET | Freq: Four times a day (QID) | ORAL | Status: DC | PRN
  Administered 2013-09-30: 600 mg via ORAL

## 2013-09-28 MED ORDER — LACTATED RINGERS IV SOLN
Freq: Once | INTRAVENOUS | Status: AC
  Administered 2013-09-28: 14:00:00 via INTRAVENOUS

## 2013-09-28 MED ORDER — ZOLPIDEM TARTRATE 5 MG PO TABS: 5.0000 mg | ORAL_TABLET | Freq: Every evening | ORAL | Status: DC | PRN

## 2013-09-28 MED ORDER — LACTATED RINGERS IV SOLN
INTRAVENOUS | Status: DC
  Administered 2013-09-28: 21:00:00 via INTRAVENOUS

## 2013-09-28 MED ORDER — NALOXONE HCL 0.4 MG/ML IJ SOLN: 0.4000 mg | INTRAMUSCULAR | Status: DC | PRN

## 2013-09-28 MED ORDER — KETOROLAC TROMETHAMINE 30 MG/ML IJ SOLN: 30.0000 mg | Freq: Four times a day (QID) | INTRAMUSCULAR | Status: AC | PRN

## 2013-09-28 MED ORDER — OXYTOCIN 10 UNIT/ML IJ SOLN
40.0000 [IU] | INTRAMUSCULAR | Status: DC | PRN
  Administered 2013-09-28: 40 [IU] via INTRAVENOUS

## 2013-09-28 MED ORDER — LACTATED RINGERS IV SOLN
INTRAVENOUS | Status: DC | PRN
  Administered 2013-09-28 (×2): via INTRAVENOUS

## 2013-09-28 MED ORDER — SIMETHICONE 80 MG PO CHEW
80.0000 mg | CHEWABLE_TABLET | Freq: Three times a day (TID) | ORAL | Status: DC
  Administered 2013-09-29 – 2013-10-01 (×6): 80 mg via ORAL
  Filled 2013-09-28 (×6): qty 1

## 2013-09-28 MED ORDER — KETOROLAC TROMETHAMINE 30 MG/ML IJ SOLN
15.0000 mg | Freq: Once | INTRAMUSCULAR | Status: AC | PRN
  Administered 2013-09-28: 30 mg via INTRAVENOUS

## 2013-09-28 MED ORDER — 0.9 % SODIUM CHLORIDE (POUR BTL) OPTIME
TOPICAL | Status: DC | PRN
  Administered 2013-09-28: 1000 mL

## 2013-09-28 MED ORDER — MEPERIDINE HCL 25 MG/ML IJ SOLN: 6.2500 mg | INTRAMUSCULAR | Status: DC | PRN

## 2013-09-28 MED ORDER — FENTANYL CITRATE 0.05 MG/ML IJ SOLN
INTRAMUSCULAR | Status: DC | PRN
  Administered 2013-09-28: 10 ug via INTRATHECAL

## 2013-09-28 MED ORDER — LACTATED RINGERS IV SOLN
INTRAVENOUS | Status: DC | PRN
  Administered 2013-09-28 (×2): via INTRAVENOUS

## 2013-09-28 MED ORDER — ONDANSETRON HCL 4 MG/2ML IJ SOLN: 4.0000 mg | INTRAMUSCULAR | Status: DC | PRN

## 2013-09-28 MED ORDER — SCOPOLAMINE 1 MG/3DAYS TD PT72
1.0000 | MEDICATED_PATCH | Freq: Once | TRANSDERMAL | Status: DC
  Administered 2013-09-28: 1.5 mg via TRANSDERMAL

## 2013-09-28 MED ORDER — SIMETHICONE 80 MG PO CHEW: 80.0000 mg | CHEWABLE_TABLET | ORAL | Status: DC | PRN

## 2013-09-28 MED ORDER — OXYTOCIN 10 UNIT/ML IJ SOLN
INTRAMUSCULAR | Status: AC
  Filled 2013-09-28: qty 4

## 2013-09-28 MED ORDER — NALBUPHINE HCL 10 MG/ML IJ SOLN: 5.0000 mg | INTRAMUSCULAR | Status: DC | PRN

## 2013-09-28 MED ORDER — DIPHENHYDRAMINE HCL 50 MG/ML IJ SOLN: 25.0000 mg | INTRAMUSCULAR | Status: DC | PRN

## 2013-09-28 MED ORDER — PROMETHAZINE HCL 25 MG/ML IJ SOLN: 6.2500 mg | INTRAMUSCULAR | Status: DC | PRN

## 2013-09-28 MED ORDER — ONDANSETRON HCL 4 MG/2ML IJ SOLN
INTRAMUSCULAR | Status: AC
  Filled 2013-09-28: qty 2

## 2013-09-28 MED ORDER — OXYTOCIN 40 UNITS IN LACTATED RINGERS INFUSION - SIMPLE MED: 62.5000 mL/h | INTRAVENOUS | Status: AC

## 2013-09-28 MED ORDER — MORPHINE SULFATE (PF) 0.5 MG/ML IJ SOLN
INTRAMUSCULAR | Status: DC | PRN
  Administered 2013-09-28: .2 mg via INTRATHECAL

## 2013-09-28 MED ORDER — METOCLOPRAMIDE HCL 5 MG/ML IJ SOLN: 10.0000 mg | Freq: Three times a day (TID) | INTRAMUSCULAR | Status: DC | PRN

## 2013-09-28 MED ORDER — MORPHINE SULFATE 0.5 MG/ML IJ SOLN
INTRAMUSCULAR | Status: AC
  Filled 2013-09-28: qty 10

## 2013-09-28 MED ORDER — NALOXONE HCL 1 MG/ML IJ SOLN
1.0000 ug/kg/h | INTRAVENOUS | Status: DC | PRN
  Filled 2013-09-28: qty 2

## 2013-09-28 MED ORDER — TETANUS-DIPHTH-ACELL PERTUSSIS 5-2.5-18.5 LF-MCG/0.5 IM SUSP: 0.5000 mL | Freq: Once | INTRAMUSCULAR | Status: DC

## 2013-09-28 MED ORDER — ONDANSETRON HCL 4 MG/2ML IJ SOLN
INTRAMUSCULAR | Status: DC | PRN
  Administered 2013-09-28: 4 mg via INTRAVENOUS

## 2013-09-28 MED ORDER — DIPHENHYDRAMINE HCL 50 MG/ML IJ SOLN
12.5000 mg | INTRAMUSCULAR | Status: DC | PRN
  Administered 2013-09-29: 12.5 mg via INTRAVENOUS
  Filled 2013-09-28: qty 1

## 2013-09-28 MED ORDER — CEFAZOLIN SODIUM-DEXTROSE 2-3 GM-% IV SOLR: 2.0000 g | INTRAVENOUS | Status: DC

## 2013-09-28 MED ORDER — OXYCODONE-ACETAMINOPHEN 5-325 MG PO TABS
2.0000 | ORAL_TABLET | ORAL | Status: DC | PRN
  Administered 2013-09-29 – 2013-09-30 (×2): 2 via ORAL
  Filled 2013-09-28: qty 2

## 2013-09-28 MED ORDER — PRENATAL MULTIVITAMIN CH
1.0000 | ORAL_TABLET | Freq: Every day | ORAL | Status: DC
  Administered 2013-09-29 – 2013-10-01 (×3): 1 via ORAL
  Filled 2013-09-28 (×3): qty 1

## 2013-09-28 MED ORDER — FENTANYL CITRATE 0.05 MG/ML IJ SOLN
INTRAMUSCULAR | Status: AC
  Filled 2013-09-28: qty 2

## 2013-09-28 MED ORDER — HYDROMORPHONE HCL PF 1 MG/ML IJ SOLN
INTRAMUSCULAR | Status: AC
  Administered 2013-09-28: 0.5 mg via INTRAVENOUS
  Filled 2013-09-28: qty 1

## 2013-09-28 MED ORDER — DIPHENHYDRAMINE HCL 25 MG PO CAPS
25.0000 mg | ORAL_CAPSULE | Freq: Four times a day (QID) | ORAL | Status: DC | PRN
Start: 2013-09-28 — End: 2013-09-28

## 2013-09-28 MED ORDER — PHENYLEPHRINE 8 MG IN D5W 100 ML (0.08MG/ML) PREMIX OPTIME
INJECTION | INTRAVENOUS | Status: DC | PRN
  Administered 2013-09-28: 60 ug/min via INTRAVENOUS

## 2013-09-28 MED ORDER — DIPHENHYDRAMINE HCL 25 MG PO CAPS
25.0000 mg | ORAL_CAPSULE | ORAL | Status: DC | PRN
  Administered 2013-09-29 (×2): 25 mg via ORAL
  Filled 2013-09-28 (×3): qty 1

## 2013-09-28 MED ORDER — NALBUPHINE HCL 10 MG/ML IJ SOLN
5.0000 mg | INTRAMUSCULAR | Status: DC | PRN
  Administered 2013-09-28: 10 mg via INTRAVENOUS
  Filled 2013-09-28: qty 1

## 2013-09-28 MED ORDER — SCOPOLAMINE 1 MG/3DAYS TD PT72
MEDICATED_PATCH | TRANSDERMAL | Status: AC
  Administered 2013-09-28: 1.5 mg via TRANSDERMAL
  Filled 2013-09-28: qty 1

## 2013-09-28 SURGICAL SUPPLY — 42 items
APL SKNCLS STERI-STRIP NONHPOA (GAUZE/BANDAGES/DRESSINGS) ×1
BENZOIN TINCTURE PRP APPL 2/3 (GAUZE/BANDAGES/DRESSINGS) ×2 IMPLANT
BLADE SURG 10 STRL SS (BLADE) ×4 IMPLANT
CANISTER WOUND CARE 500ML ATS (WOUND CARE) IMPLANT
CLAMP CORD UMBIL (MISCELLANEOUS) IMPLANT
CLOTH BEACON ORANGE TIMEOUT ST (SAFETY) ×2 IMPLANT
CONTAINER PREFILL 10% NBF 15ML (MISCELLANEOUS) IMPLANT
DRAPE LG THREE QUARTER DISP (DRAPES) ×1 IMPLANT
DRESSING DISP NPWT PICO 4X12 (MISCELLANEOUS) IMPLANT
DRSG OPSITE POSTOP 4X10 (GAUZE/BANDAGES/DRESSINGS) ×2 IMPLANT
DRSG VAC ATS LRG SENSATRAC (GAUZE/BANDAGES/DRESSINGS) IMPLANT
DRSG VAC ATS SM SENSATRAC (GAUZE/BANDAGES/DRESSINGS) IMPLANT
DURAPREP 26ML APPLICATOR (WOUND CARE) ×2 IMPLANT
ELECT REM PT RETURN 9FT ADLT (ELECTROSURGICAL) ×2
ELECTRODE REM PT RTRN 9FT ADLT (ELECTROSURGICAL) ×1 IMPLANT
EXTRACTOR VACUUM M CUP 4 TUBE (SUCTIONS) IMPLANT
GLOVE BIO SURGEON STRL SZ 6.5 (GLOVE) ×2 IMPLANT
GOWN STRL REUS W/TWL LRG LVL3 (GOWN DISPOSABLE) ×4 IMPLANT
KIT ABG SYR 3ML LUER SLIP (SYRINGE) IMPLANT
NDL HYPO 25X5/8 SAFETYGLIDE (NEEDLE) ×1 IMPLANT
NEEDLE HYPO 25X5/8 SAFETYGLIDE (NEEDLE) ×2 IMPLANT
NS IRRIG 1000ML POUR BTL (IV SOLUTION) ×2 IMPLANT
PACK C SECTION WH (CUSTOM PROCEDURE TRAY) ×2 IMPLANT
PAD OB MATERNITY 4.3X12.25 (PERSONAL CARE ITEMS) ×2 IMPLANT
RETAINER VISCERAL (MISCELLANEOUS) ×1 IMPLANT
RTRCTR C-SECT PINK 25CM LRG (MISCELLANEOUS) ×2 IMPLANT
SCRUB PCMX 4 OZ (MISCELLANEOUS) ×2 IMPLANT
STAPLER VISISTAT 35W (STAPLE) IMPLANT
STRIP CLOSURE SKIN 1/2X4 (GAUZE/BANDAGES/DRESSINGS) ×3 IMPLANT
SUT MNCRL 0 VIOLET CTX 36 (SUTURE) ×2 IMPLANT
SUT MNCRL AB 3-0 PS2 27 (SUTURE) IMPLANT
SUT MONOCRYL 0 CTX 36 (SUTURE) ×2
SUT PLAIN 0 NONE (SUTURE) IMPLANT
SUT VIC AB 0 CTXB 36 (SUTURE) IMPLANT
SUT VIC AB 2-0 CT1 (SUTURE) ×2 IMPLANT
SUT VIC AB 2-0 CT1 27 (SUTURE) ×2
SUT VIC AB 2-0 CT1 TAPERPNT 27 (SUTURE) ×1 IMPLANT
SUT VIC AB 2-0 SH 27 (SUTURE)
SUT VIC AB 2-0 SH 27XBRD (SUTURE) IMPLANT
TOWEL OR 17X24 6PK STRL BLUE (TOWEL DISPOSABLE) ×2 IMPLANT
TRAY FOLEY CATH 14FR (SET/KITS/TRAYS/PACK) ×2 IMPLANT
WATER STERILE IRR 1000ML POUR (IV SOLUTION) ×2 IMPLANT

## 2013-09-28 NOTE — Anesthesia Procedure Notes (Addendum)
Spinal  Patient location during procedure: OR Start time: 09/28/2013 3:16 PM End time: 09/28/2013 3:18 PM Staffing Anesthesiologist: Lyn Hollingshead Performed by: anesthesiologist  Preanesthetic Checklist Completed: patient identified, surgical consent, pre-op evaluation, timeout performed, IV checked, risks and benefits discussed and monitors and equipment checked Spinal Block Patient position: sitting Prep: site prepped and draped and DuraPrep Patient monitoring: heart rate, cardiac monitor, continuous pulse ox and blood pressure Approach: midline Location: L3-4 Injection technique: single-shot Needle Needle type: Pencan  Needle gauge: 24 G Needle length: 9 cm Needle insertion depth: 5 cm Assessment Sensory level: T4

## 2013-09-28 NOTE — Lactation Note (Signed)
This note was copied from the chart of Kiara Jazzmin Newbold. Lactation Consultation Note Initial visit at 6 hours of age.  Mom holding baby STS and baby is showing feeding cues. Mom has flat nipples and already was given hand pump.  Demonstrated use and assisted with football hold on left breast.  Breast is compressible, but baby slips to tip of nipple.  Right nipple more everted with visible colostrum.  Repositioned to right breast.  Baby latches well with wide flanged lips and rhythmic sucking and good jaw movements noted. Mom appears anxious and asks about just pumping and bottle feedings.  Briefly discussed need to have breast stimulation with latch on for supply.  Mom reports that she is very happy with baby latching now.   [facility] LC resources given and discussed.  Encouraged to feed with early cues on demand.  Early newborn behavior discussed.  Hand expression demonstrated with colostrum visible.  Mom to call for assist as needed.    Patient Name: Kiara Juarez Today's Date: 09/28/2013 Reason for consult: Initial assessment   Maternal Data Has patient been taught Hand Expression?: Yes Does the patient have breastfeeding experience prior to this delivery?: No  Feeding Feeding Type: Breast Fed Length of feed: 8 min  LATCH Score/Interventions Latch: Repeated attempts needed to sustain latch, nipple held in mouth throughout feeding, stimulation needed to elicit sucking reflex. Intervention(s): Assist with latch;Breast compression  Audible Swallowing: A few with stimulation Intervention(s): Hand expression;Skin to skin Intervention(s): Skin to skin;Hand expression;Alternate breast massage  Type of Nipple: Flat Intervention(s): Hand pump  Comfort (Breast/Nipple): Soft / non-tender     Hold (Positioning): Assistance needed to correctly position infant at breast and maintain latch. Intervention(s): Breastfeeding basics reviewed;Support Pillows;Position options;Skin to skin  LATCH Score:  6  Lactation Tools Discussed/Used WIC Program: Yes   Consult Status Consult Status: Follow-up Date: 09/29/13 Follow-up type: In-patient    Shoptaw, Justine Null 09/28/2013, 10:30 PM

## 2013-09-28 NOTE — Anesthesia Postprocedure Evaluation (Signed)
Anesthesia Post Note  Patient: Kiara Juarez  Procedure(s) Performed: Procedure(s) (LRB): CESAREAN SECTION (N/A)  Anesthesia type: Spinal  Patient location: PACU  Post pain: Pain level controlled  Post assessment: Post-op Vital signs reviewed  Last Vitals:  Filed Vitals:   09/28/13 1700  BP: 96/73  Pulse: 59  Temp: 36.2 C  Resp: 19    Post vital signs: Reviewed  Level of consciousness: awake  Complications: No apparent anesthesia complications

## 2013-09-28 NOTE — Anesthesia Preprocedure Evaluation (Signed)
Anesthesia Evaluation  Patient identified by MRN, date of birth, ID band Patient awake    Reviewed: Allergy & Precautions, H&P , NPO status , Patient's Chart, lab work & pertinent test results  Airway Mallampati: I TM Distance: >3 FB Neck ROM: full    Dental no notable dental hx.    Pulmonary former smoker,    Pulmonary exam normal       Cardiovascular negative cardio ROS      Neuro/Psych    GI/Hepatic negative GI ROS, Neg liver ROS,   Endo/Other  Hypothyroidism   Renal/GU negative Renal ROS     Musculoskeletal   Abdominal Normal abdominal exam  (+)   Peds  Hematology   Anesthesia Other Findings   Reproductive/Obstetrics (+) Pregnancy                           Anesthesia Physical Anesthesia Plan  ASA: II  Anesthesia Plan: Spinal   Post-op Pain Management:    Induction:   Airway Management Planned:   Additional Equipment:   Intra-op Plan:   Post-operative Plan:   Informed Consent: I have reviewed the patients History and Physical, chart, labs and discussed the procedure including the risks, benefits and alternatives for the proposed anesthesia with the patient or authorized representative who has indicated his/her understanding and acceptance.     Plan Discussed with: CRNA and Surgeon  Anesthesia Plan Comments:         Anesthesia Quick Evaluation

## 2013-09-28 NOTE — Transfer of Care (Signed)
Immediate Anesthesia Transfer of Care Note  Patient: Kiara Juarez  Procedure(s) Performed: Procedure(s): CESAREAN SECTION (N/A)  Patient Location: PACU  Anesthesia Type:Spinal  Level of Consciousness: awake, alert , oriented and patient cooperative  Airway & Oxygen Therapy: Patient Spontanous Breathing  Post-op Assessment: Report given to PACU RN and Post -op Vital signs reviewed and stable  Post vital signs: Reviewed and stable  Complications: No apparent anesthesia complications

## 2013-09-28 NOTE — H&P (Signed)
Kiara Juarez is a 28 y.o. female presenting for a scheduled C/D. Maternal Medical History:  Reason for admission: She presents for a scheduled C/D for a breech presentation.  She declined an ECV attempt.  Fetal activity: Perceived fetal activity is normal.    Prenatal complications: H/O hypothyroidism  Prenatal Complications - Diabetes: none.    OB History   Grav Para Term Preterm Abortions TAB SAB Ect Mult Living   2    1   1        Past Medical History  Diagnosis Date  . Hypothyroid   . Headache(784.0)     otc meds prn  . Blood transfusion without reported diagnosis    Past Surgical History  Procedure Laterality Date  . No past surgeries    . Dilation and evacuation N/A 03/17/2012    Procedure: DILATATION AND EVACUATION;  Surgeon: Lahoma Crocker, MD;  Location: [place] [facility];  Service: Gynecology;  Laterality: N/A;  . Laparotomy N/A 04/02/2012    Procedure: EXPLORATORY LAPAROTOMY;  Surgeon: Lahoma Crocker, MD;  Location: [place] [facility];  Service: Gynecology;  Laterality: N/A;  . Unilateral salpingectomy Left 04/02/2012    Procedure: UNILATERAL SALPINGECTOMY;  Surgeon: Lahoma Crocker, MD;  Location: [place] [facility];  Service: Gynecology;  Laterality: Left;  partial salpingectomy   Family History: family history includes Arthritis in her other; Asthma in her maternal grandmother; Breast cancer in her other; Diabetes in her maternal grandfather, maternal grandmother, and other; Heart disease in her mother; Hypertension in her other; Kidney disease in her maternal grandfather. Social History:  reports that she has quit smoking. Her smoking use included Cigarettes. She has a 1.25 pack-year smoking history. She has never used smokeless tobacco. She reports that she does not drink alcohol or use illicit drugs.     Review of Systems  Constitutional: Negative for fever.  Eyes: Negative for blurred vision.  Respiratory: Negative for shortness of breath.   Gastrointestinal: Negative for  vomiting.  Skin: Negative for rash.  Neurological: Negative for headaches.      Last menstrual period 12/26/2012. Maternal Exam:  Abdomen: Fetal presentation: breech  Introitus: not evaluated.   Cervix: not evaluated.   Fetal Exam Fetal Monitor Review: Mode: hand-held doppler probe.       Physical Exam  Constitutional: She appears well-developed.  HENT:  Head: Normocephalic.  Neck: Neck supple. No thyromegaly present.  Cardiovascular: Normal rate and regular rhythm.   Respiratory: Breath sounds normal.  GI: Soft. Bowel sounds are normal.  Skin: No rash noted.    Prenatal labs: ABO, Rh: A/POS/-- (01/29 0954) Antibody: NEG (01/29 0954) Rubella: 3.38 (01/29 0954) RPR: NON REAC (09/08 1135)  HBsAg: NEGATIVE (01/29 0954)  HIV: NONREACTIVE (06/10 1128)  GBS: Detected (08/17 1639)   Assessment/Plan: IUP @ [redacted]w[redacted]d.  Breech presentation.  Admit Primary C/D   JACKSON-MOORE,Irmgard Rampersaud A 09/28/2013, 1:04 PM

## 2013-09-28 NOTE — Op Note (Signed)
Cesarean Section Procedure Note   Kiara Juarez   09/28/2013  Indications: Breech Presentation   Pre-operative Diagnosis: BREECH PRESENTATION, INTRAUTERINE PREGNANCY @ [redacted]w[redacted]d   Post-operative Diagnosis: Same   Surgeon: Lahoma Crocker A  Assistants: Baltazar Najjar A  Anesthesia: spinal  Procedure Details:  The patient was seen in the Holding Room. The risks, benefits, complications, treatment options, and expected outcomes were discussed with the patient. The patient concurred with the proposed plan, giving informed consent. The patient was identified as Kiara Juarez and the procedure verified as C-Section Delivery. A Time Out was held and the above information confirmed.  After induction of anesthesia, the patient was draped and prepped in the usual sterile manner. A transverse incision was made and carried down through the subcutaneous tissue to the fascia. The fascial incision was made and extended transversely. The fascia was separated from the underlying rectus tissue superiorly. The peritoneum was identified and entered. The peritoneal incision was extended longitudinally. An Alexis retractor was placed into the incision.  The utero-vesical peritoneal reflection was incised transversely and the bladder flap was bluntly freed from the lower uterine segment. A low transverse uterine incision was made. Delivered from a frank breech presentation was a living newborn female infant. APGAR (1 MIN): 8   APGAR (5 MINS): 9      A cord ph was not sent. The umbilical cord was clamped and cut cord. A sample was obtained for evaluation. The placenta was removed intact and appeared normal.  The uterine incision was closed with running locked sutures of 1-0 Monocryl. A second imbricating layer of the same suture was placed.  Hemostasis was observed. The paracolic gutters were irrigated. The parieto peritoneum was closed in a running fashion with 2-0 Vicryl.  The fascia was then reapproximated with  running sutures of 0 PDS.  The skin was closed with suture.  Instrument, sponge, and needle counts were correct prior the abdominal closure and were correct at the conclusion of the case.    Findings:  See above   Estimated Blood Loss: 600 ml  Total IV Fluids: per Anesthesiology  Urine Output: per Anesthesiology  Specimens: None   Complications: no complications  Disposition: PACU - hemodynamically stable.  Maternal Condition: stable   Baby condition / location:  Couplet care / Skin to Skin    Signed: Surgeon(s): Lahoma Crocker, MD Shelly Bombard, MD

## 2013-09-29 ENCOUNTER — Encounter (HOSPITAL_COMMUNITY): Payer: Self-pay | Admitting: Obstetrics & Gynecology

## 2013-09-29 LAB — CBC
HCT: 33 % — ABNORMAL LOW (ref 36.0–46.0)
Hemoglobin: 11.4 g/dL — ABNORMAL LOW (ref 12.0–15.0)
MCH: 32.2 pg (ref 26.0–34.0)
MCHC: 34.5 g/dL (ref 30.0–36.0)
MCV: 93.2 fL (ref 78.0–100.0)
Platelets: 202 10*3/uL (ref 150–400)
RBC: 3.54 MIL/uL — AB (ref 3.87–5.11)
RDW: 12.9 % (ref 11.5–15.5)
WBC: 8.2 10*3/uL (ref 4.0–10.5)

## 2013-09-29 LAB — BIRTH TISSUE RECOVERY COLLECTION (PLACENTA DONATION)

## 2013-09-29 MED ORDER — PNEUMOCOCCAL VAC POLYVALENT 25 MCG/0.5ML IJ INJ
0.5000 mL | INJECTION | INTRAMUSCULAR | Status: DC
  Filled 2013-09-29: qty 0.5

## 2013-09-29 NOTE — Lactation Note (Signed)
This note was copied from the chart of Girl Mikaella Escalona. Lactation Consultation Note  Patient Name: Girl Yasmyn Bellisario ZDGLO'V Date: 09/29/2013 Reason for consult: Follow-up assessment;Difficult latch;Breast/nipple pain due to one flat nipple.  [place] notified by NT, Sarah that mom was getting discouraged with latching difficulty and nipple pain.  LC discussed dyad with RN, Caryl Pina who has tried assisting her to latch baby today and used a NS at most recent feeding, but bleeding from previous trauma noted in NS.  DEBP was provided by RN. LC reviewed use of DEBP, NS, comfort gelpads and encouraged mom to rest nipples for a few feedings and combine hand expression/DEBP for next few feedings, then resume latching with assistance of RN or LC tonight.     Maternal Data    Feeding Feeding Type: Breast Fed  LATCH Score/Interventions Latch: Repeated attempts needed to sustain latch, nipple held in mouth throughout feeding, stimulation needed to elicit sucking reflex. Intervention(s): Assist with latch  Audible Swallowing: A few with stimulation Intervention(s): Skin to skin;Hand expression  Type of Nipple: Flat Intervention(s): Shells;Double electric pump  Comfort (Breast/Nipple): Soft / non-tender     Hold (Positioning): Assistance needed to correctly position infant at breast and maintain latch. Intervention(s): Skin to skin;Support Pillows;Position options  LATCH Score: 6 (previous latch assessment by RN)  Lactation Tools Discussed/Used Tools: Nipple Shields;Comfort gels;Feeding cup;Other (comment) (curved-tip syringes) Nipple shield size: 20 Pump Review: Setup, frequency, and cleaning;Milk Storage Initiated by:: RN initiated and [place] reinforced Date initiated:: 09/29/13   Consult Status Consult Status: Follow-up Date: 09/30/13 Follow-up type: In-patient    Junious Dresser [facility] 09/29/2013, 6:10 PM

## 2013-09-29 NOTE — Addendum Note (Signed)
Addendum created 09/29/13 0805 by Tobin Chad, CRNA   Modules edited: Notes Section   Notes Section:  File: [id number]

## 2013-09-29 NOTE — Anesthesia Postprocedure Evaluation (Signed)
  Anesthesia Post-op Note  Patient: Kiara Juarez  Procedure(s) Performed: Procedure(s): CESAREAN SECTION (N/A)  Patient Location: Mother/Baby  Anesthesia Type:Spinal  Level of Consciousness: awake  Airway and Oxygen Therapy: Patient Spontanous Breathing  Post-op Pain: none  Post-op Assessment: Post-op Vital signs reviewed, Patient's Cardiovascular Status Stable, Respiratory Function Stable, Patent Airway, No signs of Nausea or vomiting, Adequate PO intake, Pain level controlled, No headache, No backache, No residual numbness and No residual motor weakness  Post-op Vital Signs: Reviewed and stable  Last Vitals:  Filed Vitals:   09/29/13 0400  BP: 99/61  Pulse: 69  Temp: 37.1 C  Resp: 18    Complications: No apparent anesthesia complications

## 2013-09-29 NOTE — Progress Notes (Signed)
Patient ID: Kiara Juarez, female   DOB: 10/20/1985, 28 y.o.   MRN: [id number] Subjective: POD# 1 s/p Cesarean Delivery.  Indications: breech  RH status/Rubella reviewed. Feeding: breast Patient reports tolerating PO.  Denies HA/SOB/C/P/N/V/dizziness.  Breast symptoms: No..  She reports vaginal bleeding as normal, without clots.  She is ambulating without difficulty.     Objective: Vital signs in last 24 hours: BP 100/52  Pulse 63  Temp(Src) 97.8 F (36.6 C) (Oral)  Resp 18  Ht 5\' 3"  (1.6 m)  Wt 83.462 kg (184 lb)  BMI 32.60 kg/m2  SpO2 98%  LMP 12/26/2012  Breastfeeding? Unknown       Physical Exam:  General: alert CV: Regular rate and rhythm Resp: clear Abdomen: soft, nontender, normal bowel sounds Lochia: minimal Uterine Fundus: firm, below umbilicus, nontender Incision: bloody drainage present Ext: extremities normal, atraumatic, no cyanosis or edema    Recent Labs  09/27/13 1135 09/29/13 0530  HGB 13.4 11.4*  HCT 38.7 33.0*      Assessment/Plan: 28 y.o.  status post Cesarean section. POD# 1.   Doing well, stable.              Advance diet as tolerated Start po pain meds  HLIV  Ambulate IS Routine post-op care  JACKSON-MOORE,Shirlette Scarber A 09/29/2013, 9:11 AM

## 2013-09-29 NOTE — Progress Notes (Signed)
CSW acknowledged consult due to MOB's history of depression.  CSW attempted to meet with MOB at 1:45pm, but MOB had multiple visitors.  CSW will attempt to meet with MOB on 9/11.

## 2013-09-30 MED ORDER — LEVOTHYROXINE SODIUM 137 MCG PO TABS
137.0000 ug | ORAL_TABLET | Freq: Every day | ORAL | Status: DC
  Administered 2013-09-30 – 2013-10-01 (×2): 137 ug via ORAL
  Filled 2013-09-30 (×3): qty 1

## 2013-09-30 NOTE — Lactation Note (Signed)
This note was copied from the chart of Girl Zairah Arista. Lactation Consultation Note Mom c/o cracked bleeding tender nipples. Using #20 NS. Has comfort gels. Fitted #16 NS, states feels comfortable. Baby has good wide flange. Hand expression w/easy colostrum flow. Encouraged to rub on nipples for soreness. Discussed baby wt. Loss 7%. Discussed importance of deep latch and milk transfer. Moms breast are feeling slightly filling. Encouraged to massage while feeding to express more colostrum. Baby latched well in football position. Encouraged comfort during feedings to allow good flow. Pointers on positioning, shallow latch v/s deep latch. Encouraged mom to feed on cues, if no cues in 3 hrs. Wake baby for feeding. Keep strict I&O documented. Mom stating when post-pumping, nothing coming out. Explained about thickness of colostrum, but to cont. Pumping d/t stimulation of breast. Did encourage to not pump after this next feeding d/t nipples cracked and bleeding.  Patient Name: Girl Iveth Heidemann ZYSAY'T Date: 09/30/2013 Reason for consult: Follow-up assessment;Infant weight loss   Maternal Data    Feeding Feeding Type: Breast Fed Length of feed: 15 min (still feeding)  LATCH Score/Interventions Latch: Repeated attempts needed to sustain latch, nipple held in mouth throughout feeding, stimulation needed to elicit sucking reflex. Intervention(s): Adjust position;Assist with latch;Breast massage;Breast compression  Audible Swallowing: A few with stimulation Intervention(s): Skin to skin;Hand expression Intervention(s): Alternate breast massage;Hand expression  Type of Nipple: Flat Intervention(s): Shells;Double electric pump  Comfort (Breast/Nipple): Engorged, cracked, bleeding, large blisters, severe discomfort Problem noted: Cracked, bleeding, blisters, bruises Intervention(s): Expressed breast milk to nipple;Double electric pump     Hold (Positioning): Assistance needed to correctly  position infant at breast and maintain latch. Intervention(s): Breastfeeding basics reviewed;Support Pillows;Position options;Skin to skin  LATCH Score: 4  Lactation Tools Discussed/Used Tools: Shells;Nipple [place] Fuel;Pump Nipple shield size: 16 Shell Type: Inverted Breast pump type: Double-Electric Breast Pump   Consult Status Consult Status: Follow-up Date: 09/30/13 Follow-up type: In-patient    Carlyn Mullenbach, Elta Guadeloupe 09/30/2013, 2:25 AM

## 2013-09-30 NOTE — Progress Notes (Signed)
Called MD regarding restart of Synthroid.  MD stated she would look up dose taken before pregnancy and return my call.  Will continue to monitor.  [place] RN

## 2013-09-30 NOTE — Progress Notes (Signed)
Clinical Social Work Department PSYCHOSOCIAL ASSESSMENT - MATERNAL/CHILD 09/30/2013  Patient:  Kiara Juarez,Kiara Juarez  Account Number:  [id number]  Admit Date:  09/28/2013  Childs Name:   Kiara Juarez   Clinical Social Worker:  Isaak Delmundo, CLINICAL SOCIAL WORKER   Date/Time:  09/30/2013 08:30 AM  Date Referred:  09/28/2013   Referral source  [facility]     Referred reason  Depression/Anxiety   Other referral source:    I:  FAMILY / HOME ENVIRONMENT Child's legal guardian:  PARENT  Guardian - Name Guardian - Age Guardian - Address  Kiara Juarez 28 [address] [place], [place] [address]  Kiara Juarez  same as above   Other household support members/support persons Other support:   MOB confirmed that she has a very supportive family that live in [place].  She stated that she feels well supported and denied barriers to asking for help.    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   Financial resources:  Private Insurance If Medicaid - County:  [place]  School / Grade:  Juarez/A Maternity Care Coordinator / Child Services Coordination / Early Interventions:   Juarez/A  Cultural issues impacting care:   None reported    III  STRENGTHS Strengths  Adequate Resources  Home prepared for Child (including basic supplies)  Supportive family/friends   Strength comment:    IV  RISK FACTORS AND CURRENT PROBLEMS Current Problem:  YES   Risk Factor & Current Problem Patient Issue Family Issue Risk Factor / Current Problem Comment  Mental Illness Y Juarez MOB presents with mental health history signficant for depression.  MOB stated that it was situational early in 2013, and denied any symptoms since the end of 2013.    V  SOCIAL WORK ASSESSMENT CSW met with MOB in her room in order to  complete the assessment.  Consult was ordered due to MOB presenting with history of depression.  MOB was receptive to CSW intervention, but processed her thoughts and feelings minimally despite CSW attempts to normalize her feelings.  FOB present for the assessment with MOB's consent, but he participated minimally.  MOB and FOB presented as attentive to the baby and presented as attempting to bond with her.  MOB displayed appropriate range in affect and was in a pleasant mood.  MOB did not present with any acute symptoms of depression.  CSW assisted MOB to process her thoughts and feelings as she becomes a first time mother.  MOB expressed excitement, but did acknowledge feeling overwhelmed and saddened since she is having a difficult time breastfeeding. MOB briefly discussed a belief that it was because she was doing something wrong, but upon further processing, MOB responded to normalization of breastfeeding being a process and taking time to learn.  She shared that she is more hopeful since it is getting easier with time.  MOB shared that the hope is prepared and they are eager to return home.  She did discuss some stress related to having to take time off without leave since her employer does not offer her any unpaid lead.  She shared that "we will see how it goes" with only living off of FOB's income for a short period of time.  MOB did not present with acute anxiety as she discussed this stressor, but she   recognized that it has the potential to be stressful for her as she transitions into the postpartum period.    MOB acknowledged history of depression, but she stated that it occurred "years ago".  MOB clarified that she was diagnosed in 2013 due to "life stressors".  MOB presented with minimal interest in further processing, but stated that she was prescribed Wellbutrin for brief period of time.  Per MOB, she discontinued the medications toward the end of 2013 and denied any symptoms occuring since then.   MOB denied any symptoms during her pregnancy as well.  CSW provided education on postpartum depression, and MOB verbalized understanding of the signs and symptoms that would warrant her seeking professional help.    No barriers to discharge.   VI SOCIAL WORK PLAN Social Work Plan  Patient/Family Education  No Further Intervention Required / No Barriers to Discharge   Type of pt/family education:   Postpartum depression and anxiety   If child protective services report - county:   If child protective services report - date:   Information/referral to community resources comment:   Other social work plan:   CSW to provide ongoing emotional support PRN.     

## 2013-09-30 NOTE — Lactation Note (Signed)
This note was copied from the chart of Kiara Juarez. Lactation Consultation Note Follow up visit at 63 hours of age.  Baby last fed about 5 hours ago.  Mom reports 2 attempts with baby STS and baby was sleepy and did not eat.  Baby asleep in visitors arms.  Mom agrees to wake baby and place STS.  Baby awakens when undressed and shows feeding cues.  Baby has a strong organized suck on gloved finger.  Mom is using a #16 NS and used DEBP to prepump.  Mom is able to apply shield well and latches baby almost independently.  Baby requires stimulation to maintain sucking.  Removed to check nipple shield size and for colostrum collection.  No colostrum visible and nipple shield appears to small with nipple rubbing inside circumference of shield.  Fitted with #20 that appear to large, but checked after feeding with good fit.  Still no colostrum seen, but few swallows audible.  Mom feels #20 is more comfortable, but denies any pain with latch for either size nipple shield today.  Hand expressed right nipple to rub into sore nipples.  Mom continues to use comfort gels.  Encouraged to post pump and to call [facility] RN if baby is not feeding in 3 hours. Baby has had 2 voids and 2 stools, but seems to be too sleepy for feeding is is currently 5#15 oz.  Discussed supplementation of formula with syringe at nipple shield or bottle and mom prefers to use her bottle that is washed.  Encouraged to offer 20-30 mls including good burping with every feeding and to document how much baby takes.  Baby is currently latched to right breast, nipple is more erect than right, and mom prefers nipple shield to latch baby.  Baby is more active on this side but remains sleepy and needing stimulation to continue feeding.  Report given to [facility] RN.    Patient Name: Kiara Juarez [id number] Date: 09/30/2013 Reason for consult: Follow-up assessment;Difficult latch;Infant < 6lbs;Breast/nipple pain   Maternal Data    Feeding Feeding Type:  Breast Fed Length of feed:  (observed 15 minutes audible swallows no colostrum observed in NS)  LATCH Score/Interventions Latch: Repeated attempts needed to sustain latch, nipple held in mouth throughout feeding, stimulation needed to elicit sucking reflex. Intervention(s): Adjust position;Assist with latch;Breast massage;Breast compression  Audible Swallowing: A few with stimulation Intervention(s): Skin to skin;Hand expression  Type of Nipple: Flat Intervention(s): Double electric pump  Comfort (Breast/Nipple): Filling, red/small blisters or bruises, mild/mod discomfort Problem noted: Cracked, bleeding, blisters, bruises Intervention(s): Double electric pump  Problem noted: Mild/Moderate discomfort  Hold (Positioning): No assistance needed to correctly position infant at breast. Intervention(s): Skin to skin;Position options;Support Pillows;Breastfeeding basics reviewed  LATCH Score: 6  Lactation Tools Discussed/Used Tools: Nipple Shields Nipple shield size: 16;20   Consult Status Consult Status: Follow-up Date: 10/01/13 Follow-up type: In-patient    [name] 09/30/2013, 5:00 PM

## 2013-09-30 NOTE — Progress Notes (Signed)
Subjective: Postpartum Day 2: Cesarean Delivery Patient reports tolerating PO and no problems voiding.    Objective: Vital signs in last 24 hours: Temp:  [97.4 F (36.3 C)-98.6 F (37 C)] 97.4 F (36.3 C) (09/11 0626) Pulse Rate:  [63-73] 73 (09/11 0626) Resp:  [18] 18 (09/11 0626) BP: (99-109)/(48-66) 109/66 mmHg (09/11 6546)  Physical Exam:  General: alert and no distress Lochia: appropriate Uterine Fundus: firm Incision: healing well DVT Evaluation: No evidence of DVT seen on physical exam.   Recent Labs  09/27/13 1135 09/29/13 0530  HGB 13.4 11.4*  HCT 38.7 33.0*    Assessment/Plan: Status post Cesarean section. Doing well postoperatively.  Continue current care.  Najeeb Uptain A 09/30/2013, 8:16 AM

## 2013-10-01 NOTE — Lactation Note (Signed)
This note was copied from the chart of Kiara Kiara Juarez. Lactation Consultation Note  Follow up visit for feeding assessment.  Baby is at a 9% weight loss today.  Assisted putting baby skin to skin in football hold.  Breasts are beginning to fill.  Mom has healing positional stripe on tip of nipple.  She is using ice and comfort gels.  Drop of transitional milk hand expressed prior to latch.  Several attempts made to latch baby without shield but baby continued to slip off.  20 mm nipple shield applied and baby latched easily and well once bottom lip untucked.  Observed baby nursing actively with a few swallows.  Mom comfortable during feeding.  Discussed with mom how supplementing baby every 3 hours will energize baby to continue feeding well and bring milk to volume.  Mother agrees to supplement with formula until milk volume increases.  She also has a DEBP at home.  Plan is to feed baby with cues and post pump x 15 minutes and supplement with 15 mls of 22 calorie similac every 3 hours.  Mom also instructed to give baby any expressed milk obtained.  She obtained 9 mls 3 hours ago.  Encouraged outpatient services and support.  Patient Name: Kiara Juarez Date: 10/01/2013 Reason for consult: Follow-up assessment;Infant < 6lbs;Infant weight loss;Difficult latch   Maternal Data    Feeding Feeding Type: Breast Fed Length of feed: 20 min  LATCH Score/Interventions Latch: Grasps breast easily, tongue down, lips flanged, rhythmical sucking. (with 20 mm nipple shield) Intervention(s): Adjust position;Assist with latch;Breast massage;Breast compression  Audible Swallowing: A few with stimulation Intervention(s): Skin to skin;Hand expression Intervention(s): Skin to skin;Hand expression;Alternate breast massage  Type of Nipple: Flat Intervention(s): Double electric pump  Comfort (Breast/Nipple): Filling, red/small blisters or bruises, mild/mod discomfort Problem noted: Cracked,  bleeding, blisters, bruises Intervention(s): Expressed breast milk to nipple;Double electric pump  Problem noted: Cracked, bleeding, blisters, bruises;Mild/Moderate discomfort Interventions (Mild/moderate discomfort): Comfort gels  Hold (Positioning): No assistance needed to correctly position infant at breast. Intervention(s): Breastfeeding basics reviewed;Support Pillows;Position options;Skin to skin  LATCH Score: 7  Lactation Tools Discussed/Used Tools: Nipple Shields Nipple shield size: 20   Consult Status Consult Status: Follow-up Date: 10/01/13 Follow-up type: In-patient    Ave Filter 10/01/2013, 11:12 AM

## 2013-10-01 NOTE — Discharge Summary (Signed)
Obstetric Discharge Summary Reason for Admission: onset of labor Prenatal Procedures: none Intrapartum Procedures: cesarean: low cervical, transverse Postpartum Procedures: none Complications-Operative and Postpartum: none Hemoglobin  Date Value Ref Range Status  09/29/2013 11.4* 12.0 - 15.0 g/dL Final     HCT  Date Value Ref Range Status  09/29/2013 33.0* 36.0 - 46.0 % Final    Physical Exam:  General: alert Lochia: appropriate Uterine Fundus: firm Incision: healing well DVT Evaluation: No evidence of DVT seen on physical exam.  Discharge Diagnoses: Term Pregnancy-delivered  Discharge Information: Date: 10/01/2013 Activity: pelvic rest Diet: routine Medications: Percocet Condition: improved Instructions: refer to practice specific booklet Discharge to: home Follow-up Information   Follow up with HARPER,CHARLES A, MD.   Specialty:  Obstetrics and Gynecology   Contact information:   [address] [place] [address] [phone]       Newborn Data: Live born female  Birth Weight: 6 lb 6.1 oz (2895 g) APGAR: 8, 9  Home with mother.  Lewie Deman A 10/01/2013, 7:50 AM

## 2013-10-01 NOTE — Discharge Instructions (Signed)
Discharge instructions   You can wash your hair  Shower  Eat what you want  Drink what you want  See me in 6 weeks  Your ankles are going to swell more in the next 2 weeks than when pregnant  No sex for 6 weeks   Crestina Strike A, MD 10/01/2013

## 2013-10-05 ENCOUNTER — Encounter: Payer: Self-pay | Admitting: Obstetrics & Gynecology

## 2013-10-05 ENCOUNTER — Ambulatory Visit (INDEPENDENT_AMBULATORY_CARE_PROVIDER_SITE_OTHER): Payer: BC Managed Care – PPO | Admitting: Obstetrics & Gynecology

## 2013-10-05 ENCOUNTER — Encounter: Payer: Self-pay | Admitting: *Deleted

## 2013-10-05 VITALS — BP 115/81 | HR 76 | Temp 97.5°F | Ht 63.0 in | Wt 179.0 lb

## 2013-10-05 DIAGNOSIS — O9089 Other complications of the puerperium, not elsewhere classified: Secondary | ICD-10-CM

## 2013-10-05 DIAGNOSIS — R52 Pain, unspecified: Secondary | ICD-10-CM

## 2013-10-05 DIAGNOSIS — N390 Urinary tract infection, site not specified: Secondary | ICD-10-CM

## 2013-10-05 DIAGNOSIS — O26899 Other specified pregnancy related conditions, unspecified trimester: Secondary | ICD-10-CM

## 2013-10-05 DIAGNOSIS — O909 Complication of the puerperium, unspecified: Secondary | ICD-10-CM

## 2013-10-05 MED ORDER — IBUPROFEN 800 MG PO TABS: 800.0000 mg | ORAL_TABLET | Freq: Three times a day (TID) | ORAL | Status: DC

## 2013-10-05 MED ORDER — SULFAMETHOXAZOLE-TMP DS 800-160 MG PO TABS: 1.0000 | ORAL_TABLET | Freq: Two times a day (BID) | ORAL | Status: DC

## 2013-10-05 NOTE — Progress Notes (Signed)
Patient ID: MASSIAH LONGANECKER, female   DOB: 12/16/1985, 28 y.o.   MRN: [id number] Patient is hving pain on Left. Patient also is havng pian with urination.

## 2013-10-06 ENCOUNTER — Ambulatory Visit: Payer: BC Managed Care – PPO | Admitting: Obstetrics & Gynecology

## 2013-10-06 LAB — URINE CULTURE
COLONY COUNT: NO GROWTH
ORGANISM ID, BACTERIA: NO GROWTH

## 2013-10-09 ENCOUNTER — Encounter: Payer: Self-pay | Admitting: Obstetrics & Gynecology

## 2013-10-09 NOTE — Progress Notes (Signed)
Patient ID: Kiara Juarez, female   DOB: April 25, 1985, 28 y.o.   MRN: [id number]   Chief Complaint  Patient presents with  . Postpartum Follow-up    HPI Kiara Juarez is a 28 y.o. female.  She is 1 week s/p a C/D.  She c/o dysuria.  She also has a burning pain at the left margin of the incision.  HPI  Past Medical History  Diagnosis Date  . Hypothyroid   . Headache(784.0)     otc meds prn  . Blood transfusion without reported diagnosis     Past Surgical History  Procedure Laterality Date  . No past surgeries    . Dilation and evacuation N/A 03/17/2012    Procedure: DILATATION AND EVACUATION;  Surgeon: Lahoma Crocker, MD;  Location: [facility];  Service: Gynecology;  Laterality: N/A;  . Laparotomy N/A 04/02/2012    Procedure: EXPLORATORY LAPAROTOMY;  Surgeon: Lahoma Crocker, MD;  Location: [facility];  Service: Gynecology;  Laterality: N/A;  . Unilateral salpingectomy Left 04/02/2012    Procedure: UNILATERAL SALPINGECTOMY;  Surgeon: Lahoma Crocker, MD;  Location: [facility];  Service: Gynecology;  Laterality: Left;  partial salpingectomy  . Cesarean section N/A 09/28/2013    Procedure: CESAREAN SECTION;  Surgeon: Lahoma Crocker, MD;  Location: [facility];  Service: Obstetrics;  Laterality: N/A;    Family History  Problem Relation Age of Onset  . Arthritis Other   . Breast cancer Other   . Hypertension Other   . Diabetes Other   . Heart disease Mother   . Diabetes Maternal Grandmother   . Asthma Maternal Grandmother   . Diabetes Maternal Grandfather   . Kidney disease Maternal Grandfather     Social History History  Substance Use Topics  . Smoking status: Former Smoker -- 0.25 packs/day for 5 years    Types: Cigarettes  . Smokeless tobacco: Never Used  . Alcohol Use: No    No Known Allergies  Current Outpatient Prescriptions  Medication Sig Dispense Refill  . levothyroxine (SYNTHROID, LEVOTHROID) 175 MCG tablet Take 175 mcg by mouth daily before breakfast.      .  oxyCODONE-acetaminophen (PERCOCET/ROXICET) 5-325 MG per tablet Take by mouth every 4 (four) hours as needed for severe pain.      . Prenatal Vit-Fe Fumarate-FA (MULTIVITAMIN-PRENATAL) 27-0.8 MG TABS tablet Take 1 tablet by mouth daily at 12 noon.      Marland Kitchen ibuprofen (ADVIL,MOTRIN) 800 MG tablet Take 1 tablet (800 mg total) by mouth 3 (three) times daily.  60 tablet  3  . sulfamethoxazole-trimethoprim (BACTRIM DS) 800-160 MG per tablet Take 1 tablet by mouth 2 (two) times daily.  14 tablet  0   No current facility-administered medications for this visit.    Review of Systems Review of Systems Constitutional: negative for fatigue and weight loss Respiratory: negative for cough and wheezing Cardiovascular: negative for chest pain, fatigue and palpitations Gastrointestinal: negative for abdominal pain and change in bowel habits Genitourinary: positive for dysuria Integument/breast: negative for nipple discharge Musculoskeletal:negative for myalgias Neurological: negative for gait problems and tremors Behavioral/Psych: negative for abusive relationship, depression Endocrine: negative for temperature intolerance     Blood pressure 115/81, pulse 76, temperature 97.5 F (36.4 C), height 5\' 3"  (1.6 m), weight 81.194 kg (179 lb), currently breastfeeding.  Physical Exam Physical Exam General:   alert  Skin:   no rash or abnormalities  Lungs:   clear to auscultation bilaterally  Heart:   regular rate and rhythm, S1, S2 normal, no murmur,  click, rub or gallop  Abdomen:  incision intact    The skin lateral to the left margin of the incision was infiltrated with 1% lidocaine after the area was prepped with betadine  Data Reviewed None  Assessment    R/O UTI ?Entrapped nerve     Plan    Orders Placed This Encounter  Procedures  . Urine culture  . POCT urinalysis dipstick   Meds ordered this encounter  Medications  . Prenatal Vit-Fe Fumarate-FA (MULTIVITAMIN-PRENATAL) 27-0.8 MG TABS  tablet    Sig: Take 1 tablet by mouth daily at 12 noon.  Marland Kitchen oxyCODONE-acetaminophen (PERCOCET/ROXICET) 5-325 MG per tablet    Sig: Take by mouth every 4 (four) hours as needed for severe pain.  Marland Kitchen sulfamethoxazole-trimethoprim (BACTRIM DS) 800-160 MG per tablet    Sig: Take 1 tablet by mouth 2 (two) times daily.    Dispense:  14 tablet    Refill:  0  . ibuprofen (ADVIL,MOTRIN) 800 MG tablet    Sig: Take 1 tablet (800 mg total) by mouth 3 (three) times daily.    Dispense:  60 tablet    Refill:  3    Follow up as needed or in 1 week         JACKSON-MOORE,Sanaya Gwilliam A 10/09/2013, 9:55 PM

## 2013-10-11 LAB — POCT URINALYSIS DIPSTICK
Bilirubin, UA: NEGATIVE
Blood, UA: 250
GLUCOSE UA: NEGATIVE
KETONES UA: NEGATIVE
Leukocytes, UA: NEGATIVE
Nitrite, UA: NEGATIVE
Spec Grav, UA: 1.01
UROBILINOGEN UA: NEGATIVE
pH, UA: 6.5

## 2013-10-12 ENCOUNTER — Ambulatory Visit (INDEPENDENT_AMBULATORY_CARE_PROVIDER_SITE_OTHER): Payer: BC Managed Care – PPO | Admitting: Obstetrics

## 2013-10-12 ENCOUNTER — Encounter: Payer: Self-pay | Admitting: Obstetrics

## 2013-10-12 DIAGNOSIS — Z3009 Encounter for other general counseling and advice on contraception: Secondary | ICD-10-CM

## 2013-10-12 NOTE — Progress Notes (Signed)
Subjective:     Kiara Juarez is a 28 y.o. female who presents for a postpartum visit. She is 2 weeks postpartum following a low cervical transverse Cesarean section. I have fully reviewed the prenatal and intrapartum course. The delivery was at 70 gestational weeks. Outcome: primary cesarean section, low transverse incision. Anesthesia: spinal. Postpartum course has been normal. Baby's course has been normal. Baby is feeding by breast. Bleeding thin lochia. Bowel function is normal. Bladder function is normal. Patient is not sexually active. Contraception method is abstinence. Postpartum depression screening: negative.  Tobacco, alcohol and substance abuse history reviewed.  Adult immunizations reviewed including TDAP, rubella and varicella.  The following portions of the patient's history were reviewed and updated as appropriate: allergies, current medications, past family history, past medical history, past social history, past surgical history and problem list.  Review of Systems A comprehensive review of systems was negative.   Objective:    BP 112/74  Pulse 83  Temp(Src) 97.9 F (36.6 C)  Wt 171 lb (77.565 kg)  Breastfeeding? Yes   PE:      Breasts:  Soft, NT.      Abdomen:  Soft, NT.  Incision C, D, I.      Extremities:  No C, C, E.    Assessment:    2 weeks postpartum.  Doing well.  Contraceptive options discussed.  Plan:    1. Contraception: Options discussed 2. Continue PNV's 3. Follow up in: 4 weeks or as needed.   Healthy lifestyle practices reviewed

## 2013-11-09 ENCOUNTER — Ambulatory Visit (INDEPENDENT_AMBULATORY_CARE_PROVIDER_SITE_OTHER): Payer: BC Managed Care – PPO | Admitting: Obstetrics & Gynecology

## 2013-11-09 ENCOUNTER — Encounter: Payer: Self-pay | Admitting: Obstetrics & Gynecology

## 2013-11-09 VITALS — BP 127/72 | HR 87 | Temp 97.6°F | Ht 63.0 in | Wt 168.0 lb

## 2013-11-09 DIAGNOSIS — Z30011 Encounter for initial prescription of contraceptive pills: Secondary | ICD-10-CM

## 2013-11-09 MED ORDER — NORETHINDRONE 0.35 MG PO TABS: 1.0000 | ORAL_TABLET | Freq: Every day | ORAL | Status: DC

## 2013-11-09 NOTE — Progress Notes (Signed)
Patient ID: Kiara Juarez, female   DOB: January 28, 1985, 28 y.o.   MRN: [id number] Subjective:     Kiara Juarez is a 28 y.o. female who presents for a postpartum visit. She is 6 weeks postpartum following a low cervical transverse Cesarean section. I have fully reviewed the prenatal and intrapartum course. The delivery was at 57 gestational weeks. Outcome: primary cesarean section, low transverse incision. Anesthesia: spinal. Postpartum course has been unremarkable. Baby's course has been normal. Baby is feeding by breast. Bleeding no bleeding. Bowel function is normal. Bladder function is normal. Patient is not sexually active. Contraception method is abstinence. Postpartum depression screening: negative.  Tobacco, alcohol and substance abuse history reviewed.  Adult immunizations reviewed including TDAP, rubella and varicella.  The following portions of the patient's history were reviewed and updated as appropriate: allergies, current medications, past family history, past medical history, past social history, past surgical history and problem list.  Review of Systems Pertinent items are noted in HPI.   Objective:    BP 127/72  Pulse 87  Temp(Src) 97.6 F (36.4 C)  Ht 5\' 3"  (1.6 m)  Wt 76.204 kg (168 lb)  BMI 29.77 kg/m2  Breastfeeding? Yes        General:   alert  Skin:   no rash or abnormalities  Lungs:   clear to auscultation bilaterally  Heart:   regular rate and rhythm, S1, S2 normal, no murmur, click, rub or gallop  Breasts:   normal without suspicious masses, skin or nipple changes or axillary nodes  Abdomen:  normal findings: no organomegaly, soft, non-tender and no hernia; incision well-healed  Pelvis:  External genitalia: normal general appearance Urinary system: urethral meatus normal and bladder without fullness, nontender Vaginal: normal without tenderness, induration or masses Cervix: normal appearance Adnexa: normal bimanual exam Uterus: anteverted and non-tender,  normal size    Assessment:     Normal postpartum exam.  Plan:     Contraception: oral progesterone-only contraceptive Follow up in: 6 months or as needed.  Preconception counseling provided Healthy lifestyle practices reviewed

## 2013-11-09 NOTE — Patient Instructions (Signed)
Norethindrone tablets (contraception) What is this medicine? NORETHINDRONE (nor eth IN drone) is an oral contraceptive. The product contains a female hormone known as a progestin. It is used to prevent pregnancy. This medicine may be used for other purposes; ask your health care provider or pharmacist if you have questions. COMMON BRAND NAME(S): Camila, Deblitane 28-Day, Errin, Heather, [place], Jolivette, [place], Nor-QD, Nora-BE, Norlyroc, Ortho Micronor, [employer] 28-Day What should I tell my health care provider before I take this medicine? They need to know if you have any of these conditions: -blood vessel disease or blood clots -breast, cervical, or vaginal cancer -diabetes -heart disease -kidney disease -liver disease -mental depression -migraine -seizures -stroke -vaginal bleeding -an unusual or allergic reaction to norethindrone, other medicines, foods, dyes, or preservatives -pregnant or trying to get pregnant -breast-feeding How should I use this medicine? Take this medicine by mouth with a glass of water. You may take it with or without food. Follow the directions on the prescription label. Take this medicine at the same time each day and in the order directed on the package. Do not take your medicine more often than directed. Contact your pediatrician regarding the use of this medicine in children. Special care may be needed. This medicine has been used in female children who have started having menstrual periods. A patient package insert for the product will be given with each prescription and refill. Read this sheet carefully each time. The sheet may change frequently. Overdosage: If you think you have taken too much of this medicine contact a poison control center or emergency room at once. NOTE: This medicine is only for you. Do not share this medicine with others. What if I miss a dose? Try not to miss a dose. Every time you miss a dose or take a dose late your chance of  pregnancy increases. When 1 pill is missed (even if only 3 hours late), take the missed pill as soon as possible and continue taking a pill each day at the regular time (use a back up method of birth control for the next 48 hours). If more than 1 dose is missed, use an additional birth control method for the rest of your pill pack until menses occurs. Contact your health care professional if more than 1 dose has been missed. What may interact with this medicine? Do not take this medicine with any of the following medications: -amprenavir or fosamprenavir -bosentan This medicine may also interact with the following medications: -antibiotics or medicines for infections, especially rifampin, rifabutin, rifapentine, and griseofulvin, and possibly penicillins or tetracyclines -aprepitant -barbiturate medicines, such as phenobarbital -carbamazepine -felbamate -modafinil -oxcarbazepine -phenytoin -ritonavir or other medicines for HIV infection or AIDS -St. John's wort -topiramate This list may not describe all possible interactions. Give your health care provider a list of all the medicines, herbs, non-prescription drugs, or dietary supplements you use. Also tell them if you smoke, drink alcohol, or use illegal drugs. Some items may interact with your medicine. What should I watch for while using this medicine? Visit your doctor or health care professional for regular checks on your progress. You will need a regular breast and pelvic exam and Pap smear while on this medicine. Use an additional method of birth control during the first cycle that you take these tablets. If you have any reason to think you are pregnant, stop taking this medicine right away and contact your doctor or health care professional. If you are taking this medicine for hormone related problems, it  may take several cycles of use to see improvement in your condition. This medicine does not protect you against HIV infection (AIDS)  or any other sexually transmitted diseases. What side effects may I notice from receiving this medicine? Side effects that you should report to your doctor or health care professional as soon as possible: -breast tenderness or discharge -pain in the abdomen, chest, groin or leg -severe headache -skin rash, itching, or hives -sudden shortness of breath -unusually weak or tired -vision or speech problems -yellowing of skin or eyes Side effects that usually do not require medical attention (report to your doctor or health care professional if they continue or are bothersome): -changes in sexual desire -change in menstrual flow -facial hair growth -fluid retention and swelling -headache -irritability -nausea -weight gain or loss This list may not describe all possible side effects. Call your doctor for medical advice about side effects. You may report side effects to FDA at 1-800-FDA-1088. Where should I keep my medicine? Keep out of the reach of children. Store at room temperature between 15 and 30 degrees C (59 and 86 degrees F). Throw away any unused medicine after the expiration date. NOTE: This sheet is a summary. It may not cover all possible information. If you have questions about this medicine, talk to your doctor, pharmacist, or health care provider.  2015, Elsevier/Gold Standard. (2011-09-26 16:41:35)  

## 2013-11-21 ENCOUNTER — Encounter: Payer: Self-pay | Admitting: Obstetrics & Gynecology

## 2013-12-12 ENCOUNTER — Other Ambulatory Visit: Payer: Self-pay | Admitting: *Deleted

## 2013-12-12 DIAGNOSIS — Z3041 Encounter for surveillance of contraceptive pills: Secondary | ICD-10-CM

## 2013-12-12 MED ORDER — NORGESTIMATE-ETH ESTRADIOL 0.25-35 MG-MCG PO TABS: 1.0000 | ORAL_TABLET | Freq: Every day | ORAL | Status: DC

## 2014-01-16 ENCOUNTER — Encounter: Payer: Self-pay | Admitting: *Deleted

## 2014-01-17 ENCOUNTER — Encounter: Payer: Self-pay | Admitting: Obstetrics & Gynecology

## 2014-05-04 ENCOUNTER — Ambulatory Visit: Payer: BC Managed Care – PPO | Admitting: Obstetrics & Gynecology

## 2014-05-04 ENCOUNTER — Other Ambulatory Visit: Payer: Self-pay

## 2014-05-05 LAB — CYTOLOGY - PAP

## 2014-05-26 ENCOUNTER — Encounter: Payer: Self-pay | Admitting: Neurology

## 2014-05-26 ENCOUNTER — Ambulatory Visit (INDEPENDENT_AMBULATORY_CARE_PROVIDER_SITE_OTHER): Payer: BLUE CROSS/BLUE SHIELD | Admitting: Neurology

## 2014-05-26 VITALS — BP 97/65 | HR 72 | Ht 63.0 in | Wt 158.0 lb

## 2014-05-26 DIAGNOSIS — G43009 Migraine without aura, not intractable, without status migrainosus: Secondary | ICD-10-CM

## 2014-05-26 DIAGNOSIS — Z8249 Family history of ischemic heart disease and other diseases of the circulatory system: Secondary | ICD-10-CM | POA: Diagnosis not present

## 2014-05-26 MED ORDER — RIZATRIPTAN BENZOATE 5 MG PO TBDP: 5.0000 mg | ORAL_TABLET | ORAL | Status: DC | PRN

## 2014-05-26 NOTE — Progress Notes (Signed)
PATIENT: Kiara Juarez DOB: 11-05-1985  HISTORICAL  Kiara Juarez is a 29 years old right-handed female, referred by her primary care physician Kiara Juarez for evaluation of headaches  She reported a history of headaches since twenties, increased frequency since her most recent delivery in September 2015, her typical headache are vortex region severe pounding headaches, with associated light, noise sensitivity, movement made it worse,  sleep helps  She is now having headaches at least 2-3 times each week, lasting 2-6 hours, sometimes with nausea, she has been taking ibuprofen 800 mg frequently, it helps her sometimes, but not persistently  Trigger for her headaches are stress, sleep deprivation, menstruation,  She is on contraceptives,norgestimate-ethinyl estradiol (ORTHO-CYCLEN, 28,) 0.25-35 MG-MCG tablet since January 2016,  She has strong family history of brain aneurysm, her uncle at her father's side died at age forties because of aneurysm, her father's cousin, aunt, had brain aneurysm  She has never tried triptan treatment in the past, is not nursing  REVIEW OF SYSTEMS: Full 14 system review of systems were performed, positive for headaches, dizziness, anxiety  ALLERGIES: No Known Allergies  HOME MEDICATIONS: Current Outpatient Prescriptions  Medication Sig Dispense Refill  . ibuprofen (ADVIL,MOTRIN) 800 MG tablet Take 1 tablet (800 mg total) by mouth 3 (three) times daily. 60 tablet 3  . norgestimate-ethinyl estradiol (ORTHO-CYCLEN, 28,) 0.25-35 MG-MCG tablet Take 1 tablet by mouth daily. 1 Package 11  . Prenatal Vit-Fe Fumarate-FA (MULTIVITAMIN-PRENATAL) 27-0.8 MG TABS tablet Take 1 tablet by mouth daily at 12 noon.    Marland Kitchen SYNTHROID 125 MCG tablet   3     PAST MEDICAL HISTORY: Past Medical History  Diagnosis Date  . Hypothyroid   . Headache(784.0)     otc meds prn  . Blood transfusion without reported diagnosis     PAST SURGICAL HISTORY: Past Surgical  History  Procedure Laterality Date  . Dilation and evacuation N/A 03/17/2012    Procedure: DILATATION AND EVACUATION;  Surgeon: Lahoma Crocker, MD;  Location: [facility];  Service: Gynecology;  Laterality: N/A;  . Laparotomy N/A 04/02/2012    Procedure: EXPLORATORY LAPAROTOMY;  Surgeon: Lahoma Crocker, MD;  Location: [facility];  Service: Gynecology;  Laterality: N/A;  . Unilateral salpingectomy Left 04/02/2012    Procedure: UNILATERAL SALPINGECTOMY;  Surgeon: Lahoma Crocker, MD;  Location: [facility];  Service: Gynecology;  Laterality: Left;  partial salpingectomy  . Cesarean section N/A 09/28/2013    Procedure: CESAREAN SECTION;  Surgeon: Lahoma Crocker, MD;  Location: [facility];  Service: Obstetrics;  Laterality: N/A;  . Wisdom tooth extraction      FAMILY HISTORY: Family History  Problem Relation Age of Onset  . Arthritis Maternal Grandmother   . Heart disease Mother   . Diabetes Maternal Grandmother   . Asthma Maternal Grandmother   . Diabetes Maternal Grandfather   . Kidney disease Maternal Grandfather   . Atrial fibrillation Mother   . Aneurysm      P. Uncle, P. Aunt, P. cousin x 3    SOCIAL HISTORY:  History   Social History  . Marital Status: Single    Spouse Name: N/A  . Number of Children: 1  . Years of Education: 12   Occupational History  . 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 Smoking status: Former Smoker -- 0.25 packs/day for 5 years    Types: Cigarettes  . Smokeless tobacco: Never Used  . Alcohol Use: 0.0 oz/week    0  Standard drinks or equivalent per week     Comment: Occasional - rarely  . Drug Use: No  . Sexual Activity:    Partners: Male    Birth Control/ Protection: None   Other Topics Concern  . Not on file   Social History Narrative   Lives at home with her boyfriend and daughter.   Right-handed.   1 cup caffeine most days.   PHYSICAL EXAM   Filed Vitals:   05/26/14 0911  BP: 97/65  Pulse: 72  Height:  5\' 3"  (1.6 m)  Weight: 158 lb (71.668 kg)    Not recorded      Body mass index is 28 kg/(m^2).  PHYSICAL EXAMNIATION:  Gen: NAD, conversant, well nourised, obese, well groomed                     Cardiovascular: Regular rate rhythm, no peripheral edema, warm, nontender. Eyes: Conjunctivae clear without exudates or hemorrhage Neck: Supple, no carotid bruise. Pulmonary: Clear to auscultation bilaterally   NEUROLOGICAL EXAM:  MENTAL STATUS: Speech:    Speech is normal; fluent and spontaneous with normal comprehension.  Cognition:    The patient is oriented to person, place, and time;     recent and remote memory intact;     language fluent;     normal attention, concentration,     fund of knowledge.  CRANIAL NERVES: CN II: Visual fields are full to confrontation. Fundoscopic exam is normal with sharp discs and no vascular changes. Venous pulsations are present bilaterally. Pupils are 4 mm and briskly reactive to light. Visual acuity is 20/20 bilaterally. CN III, IV, VI: extraocular movement are normal. No ptosis. CN V: Facial sensation is intact to pinprick in all 3 divisions bilaterally. Corneal responses are intact.  CN VII: Face is symmetric with normal eye closure and smile. CN VIII: Hearing is normal to rubbing fingers CN IX, X: Palate elevates symmetrically. Phonation is normal. CN XI: Head turning and shoulder shrug are intact CN XII: Tongue is midline with normal movements and no atrophy.  MOTOR: There is no pronator drift of out-stretched arms. Muscle bulk and tone are normal. Muscle strength is normal.   Shoulder abduction Shoulder external rotation Elbow flexion Elbow extension Wrist flexion Wrist extension Finger abduction Hip flexion Knee flexion Knee extension Ankle dorsi flexion Ankle plantar flexion  R 5 5 5 5 5 5 5 5 5 5 5 5   L 5 5 5 5 5 5 5 5 5 5 5 5     REFLEXES: Reflexes are 2+ and symmetric at the biceps, triceps, knees, and ankles. Plantar responses  are flexor.  SENSORY: Light touch, pinprick, position sense, and vibration sense are intact in fingers and toes.  COORDINATION: Rapid alternating movements and fine finger movements are intact. There is no dysmetria on finger-to-nose and heel-knee-shin. There are no abnormal or extraneous movements.   GAIT/STANCE: Posture is normal. Gait is steady with normal steps, base, arm swing, and turning. Heel and toe walking are normal. Tandem gait is normal.  Romberg is absent.   DIAGNOSTIC DATA (LABS, IMAGING, TESTING) - I reviewed patient records, labs, notes, testing and imaging myself where available.  Lab Results  Component Value Date   WBC 8.2 09/29/2013   HGB 11.4* 09/29/2013   HCT 33.0* 09/29/2013   MCV 93.2 09/29/2013   PLT 202 09/29/2013      Component Value Date/Time   NA 140 09/12/2013 1459   NA 141 03/17/2011   K  3.9 09/12/2013 1459   CL 105 09/12/2013 1459   CO2 26 09/12/2013 1459   GLUCOSE 101* 09/12/2013 1459   BUN 10 09/12/2013 1459   BUN 13 03/17/2011   CREATININE 0.65 09/12/2013 1459   CREATININE 0.54 04/03/2012 0805   CREATININE 0.6 03/17/2011   CALCIUM 9.4 09/12/2013 1459   PROT 4.4* 04/03/2012 0805   ALBUMIN 2.4* 04/03/2012 0805   AST 13 04/03/2012 0805   ALT 9 04/03/2012 0805   ALKPHOS 34* 04/03/2012 0805   BILITOT 0.7 04/03/2012 0805   GFRNONAA >90 04/03/2012 0805   GFRAA >90 04/03/2012 0805   Lab Results  Component Value Date   CHOL 136 03/17/2011   HDL 61 03/17/2011   LDLCALC 68 03/17/2011   TRIG 37* 03/17/2011   CHOLHDL 2.6 CALC 04/21/2006   No results found for: HGBA1C No results found for: VITAMINB12 Lab Results  Component Value Date   TSH 0.44 03/17/2011      ASSESSMENT AND PLAN  DANAH REINECKE is a 30 y.o. female  with past medical history of migraine, strong family history of brain aneurysm  1, migraine, will try Maxalt as needed, magnesium oxide, riboflavin as preventive medications 2, strong family history of brain  aneurysm, she desires for further evaluation, proceed with MRA of brain  3. Return to clinic in 2 months   Marcial Pacas, M.D. Ph.D.  [facility] [address] [place], [place] [address] Ph: [phone] Fax: [phone]

## 2014-05-26 NOTE — Patient Instructions (Signed)
Magnesium oxide 400 mg twice a day Riboflavin  100 mg twice a day 

## 2014-06-02 ENCOUNTER — Ambulatory Visit: Payer: BLUE CROSS/BLUE SHIELD | Admitting: Neurology

## 2014-08-03 ENCOUNTER — Ambulatory Visit: Payer: BLUE CROSS/BLUE SHIELD | Admitting: Neurology

## 2014-11-15 ENCOUNTER — Telehealth: Payer: Self-pay | Admitting: *Deleted

## 2014-11-15 NOTE — Telephone Encounter (Signed)
Patient called and she has had a positive home UPT. She is not sure of her LMP because she has missed some pills and she has not been keeping track. Her home test says she is 2-3 weeks. Patient wants to see Dr Jodi Mourning for her pregnancy. Due to patient's ectopic history- advised her the plan of action should be serial quants until she is high enough for [place] to check placement. After that then we can proceed as normal. Patient agrees and is coming on Friday for [place]. Dr Jodi Mourning notified and agrees with plan.

## 2014-11-17 ENCOUNTER — Other Ambulatory Visit (INDEPENDENT_AMBULATORY_CARE_PROVIDER_SITE_OTHER): Payer: BLUE CROSS/BLUE SHIELD

## 2014-11-17 VITALS — BP 119/79 | HR 78 | Temp 98.1°F | Wt 151.0 lb

## 2014-11-17 DIAGNOSIS — N926 Irregular menstruation, unspecified: Secondary | ICD-10-CM

## 2014-11-17 DIAGNOSIS — Z3201 Encounter for pregnancy test, result positive: Secondary | ICD-10-CM

## 2014-11-17 LAB — POCT URINE PREGNANCY: Preg Test, Ur: POSITIVE — AB

## 2014-11-17 NOTE — Progress Notes (Signed)
Patient in office today for a confirmation of pregnancy. Patient states she had a positive home pregnancy test. Patient states she was on birth control pills but was missing them frequently. Patient states she is unsure of her last menstrual cycle. Patient has had an Hcg drawn in office today.   BP 119/79 mmHg  Pulse 78  Temp(Src) 98.1 F (36.7 C)  Wt 151 lb (68.493 kg)

## 2014-11-18 LAB — HCG, QUANTITATIVE, PREGNANCY: hCG, Beta Chain, Quant, S: 3067.5 m[IU]/mL — ABNORMAL HIGH

## 2014-11-20 ENCOUNTER — Other Ambulatory Visit: Payer: Self-pay | Admitting: Obstetrics

## 2014-11-20 DIAGNOSIS — O3680X1 Pregnancy with inconclusive fetal viability, fetus 1: Secondary | ICD-10-CM

## 2014-11-24 ENCOUNTER — Other Ambulatory Visit: Payer: Self-pay | Admitting: Obstetrics

## 2014-11-24 ENCOUNTER — Ambulatory Visit (HOSPITAL_COMMUNITY)
Admission: RE | Admit: 2014-11-24 | Discharge: 2014-11-24 | Disposition: A | Discharge status: Home / Self Care | Payer: BLUE CROSS/BLUE SHIELD | Source: Ambulatory Visit | Attending: Obstetrics | Admitting: Obstetrics

## 2014-11-24 ENCOUNTER — Encounter: Payer: Self-pay | Admitting: Obstetrics

## 2014-11-24 DIAGNOSIS — O3680X1 Pregnancy with inconclusive fetal viability, fetus 1: Secondary | ICD-10-CM

## 2014-11-24 DIAGNOSIS — O3680X0 Pregnancy with inconclusive fetal viability, not applicable or unspecified: Secondary | ICD-10-CM | POA: Diagnosis present

## 2014-12-11 ENCOUNTER — Telehealth: Payer: Self-pay | Admitting: *Deleted

## 2014-12-11 NOTE — Telephone Encounter (Signed)
Patient requested call back 11:45 LM on VM to CB

## 2014-12-12 NOTE — Telephone Encounter (Signed)
Spoke with patient yesterday- she was having some left sided pain- reviewed reasons for pain and she is reassured.

## 2014-12-27 ENCOUNTER — Ambulatory Visit (INDEPENDENT_AMBULATORY_CARE_PROVIDER_SITE_OTHER): Payer: BLUE CROSS/BLUE SHIELD | Admitting: Obstetrics

## 2014-12-27 ENCOUNTER — Encounter: Payer: Self-pay | Admitting: Obstetrics

## 2014-12-27 VITALS — BP 113/72 | HR 90 | Temp 97.9°F | Wt 157.0 lb

## 2014-12-27 DIAGNOSIS — Z3491 Encounter for supervision of normal pregnancy, unspecified, first trimester: Secondary | ICD-10-CM | POA: Diagnosis not present

## 2014-12-27 DIAGNOSIS — O219 Vomiting of pregnancy, unspecified: Secondary | ICD-10-CM | POA: Diagnosis not present

## 2014-12-27 DIAGNOSIS — R0789 Other chest pain: Secondary | ICD-10-CM

## 2014-12-27 LAB — POCT URINALYSIS DIPSTICK
BILIRUBIN UA: NEGATIVE
Blood, UA: 50
Glucose, UA: NEGATIVE
KETONES UA: NEGATIVE
LEUKOCYTES UA: NEGATIVE
NITRITE UA: NEGATIVE
PH UA: 5.5
PROTEIN UA: NEGATIVE
Spec Grav, UA: 1.02
Urobilinogen, UA: NEGATIVE

## 2014-12-27 MED ORDER — DOXYLAMINE-PYRIDOXINE 10-10 MG PO TBEC: DELAYED_RELEASE_TABLET | ORAL | Status: DC

## 2014-12-27 NOTE — Progress Notes (Signed)
Patient reports she has noticed some SOB and chest tightness at times. O2 sat today 99.

## 2014-12-27 NOTE — Progress Notes (Signed)
Subjective:    Kiara Juarez is being seen today for her first obstetrical visit.  This is not a planned pregnancy. She is at [redacted]w[redacted]d gestation. Her obstetrical history is significant for none. Relationship with FOB: significant other, living together. Patient does intend to breast feed. Pregnancy history fully reviewed.  The information documented in the HPI was reviewed and verified.  Menstrual History: OB History    Gravida Para Term Preterm AB TAB SAB Ectopic Multiple Living   3 1 1  1   1  1        No LMP recorded. Patient is pregnant.    Past Medical History  Diagnosis Date  . Hypothyroid   . Headache(784.0)     otc meds prn  . Blood transfusion without reported diagnosis     Past Surgical History  Procedure Laterality Date  . Dilation and evacuation N/A 03/17/2012    Procedure: DILATATION AND EVACUATION;  Surgeon: Lahoma Crocker, MD;  Location: [facility];  Service: Gynecology;  Laterality: N/A;  . Laparotomy N/A 04/02/2012    Procedure: EXPLORATORY LAPAROTOMY;  Surgeon: Lahoma Crocker, MD;  Location: [facility];  Service: Gynecology;  Laterality: N/A;  . Unilateral salpingectomy Left 04/02/2012    Procedure: UNILATERAL SALPINGECTOMY;  Surgeon: Lahoma Crocker, MD;  Location: [facility];  Service: Gynecology;  Laterality: Left;  partial salpingectomy  . Cesarean section N/A 09/28/2013    Procedure: CESAREAN SECTION;  Surgeon: Lahoma Crocker, MD;  Location: [facility];  Service: Obstetrics;  Laterality: N/A;  . Wisdom tooth extraction       (Not in a hospital admission) No Known Allergies  Social History  Substance Use Topics  . Smoking status: Former Smoker -- 0.25 packs/day for 5 years    Types: Cigarettes  . Smokeless tobacco: Never Used  . Alcohol Use: No     Comment: Occasional - rarely    Family History  Problem Relation Age of Onset  . Arthritis Maternal Grandmother   . Heart disease Mother   . Diabetes Maternal Grandmother   . Asthma Maternal Grandmother   .  Diabetes Maternal Grandfather   . Kidney disease Maternal Grandfather   . Atrial fibrillation Mother   . Aneurysm      P. Uncle, P. Aunt, P. cousin x 3     Review of Systems Constitutional: negative for weight loss Gastrointestinal: negative for vomiting Genitourinary:negative for genital lesions and vaginal discharge and dysuria Musculoskeletal:negative for back pain Behavioral/Psych: negative for abusive relationship, depression, illegal drug usage and tobacco use    Objective:    BP 113/72 mmHg  Pulse 90  Temp(Src) 97.9 F (36.6 C)  Wt 157 lb (71.215 kg) General Appearance:    Alert, cooperative, no distress, appears stated age  Head:    Normocephalic, without obvious abnormality, atraumatic  Eyes:    PERRL, conjunctiva/corneas clear, EOM's intact, fundi    benign, both eyes  Ears:    Normal TM's and external ear canals, both ears  Nose:   Nares normal, septum midline, mucosa normal, no drainage    or sinus tenderness  Throat:   Lips, mucosa, and tongue normal; teeth and gums normal  Neck:   Supple, symmetrical, trachea midline, no adenopathy;    thyroid:  no enlargement/tenderness/nodules; no carotid   bruit or JVD  Back:     Symmetric, no curvature, ROM normal, no CVA tenderness  Lungs:     Clear to auscultation bilaterally, respirations unlabored  Chest Wall:    No tenderness or deformity  Heart:    Regular rate and rhythm, S1 and S2 normal, no murmur, rub   or gallop  Breast Exam:    No tenderness, masses, or nipple abnormality  Abdomen:     Soft, non-tender, bowel sounds active all four quadrants,    no masses, no organomegaly  Genitalia:    Normal female without lesion, discharge or tenderness  Extremities:   Extremities normal, atraumatic, no cyanosis or edema  Pulses:   2+ and symmetric all extremities  Skin:   Skin color, texture, turgor normal, no rashes or lesions  Lymph nodes:   Cervical, supraclavicular, and axillary nodes normal  Neurologic:   CNII-XII  intact, normal strength, sensation and reflexes    throughout      Lab Review Urine pregnancy test Labs reviewed yes Radiologic studies reviewed yes Assessment:    Pregnancy at [redacted]w[redacted]d weeks    Plan:      Prenatal vitamins.  Counseling provided regarding continued use of seat belts, cessation of alcohol consumption, smoking or use of illicit drugs; infection precautions i.e., influenza/TDAP immunizations, toxoplasmosis,CMV, parvovirus, listeria and varicella; workplace safety, exercise during pregnancy; routine dental care, safe medications, sexual activity, hot tubs, saunas, pools, travel, caffeine use, fish and methlymercury, potential toxins, hair treatments, varicose veins Weight gain recommendations per IOM guidelines reviewed: underweight/BMI< 18.5--> gain 28 - 40 lbs; normal weight/BMI 18.5 - 24.9--> gain 25 - 35 lbs; overweight/BMI 25 - 29.9--> gain 15 - 25 lbs; obese/BMI >30->gain  11 - 20 lbs Problem list reviewed and updated. FIRST/CF mutation testing/NIPT/QUAD SCREEN/fragile X/Ashkenazi Jewish population testing/Spinal muscular atrophy discussed: requested. Role of ultrasound in pregnancy discussed; fetal survey: requested. Amniocentesis discussed: not indicated. VBAC calculator score: VBAC consent form provided Meds ordered this encounter  Medications  . levothyroxine (SYNTHROID, LEVOTHROID) 137 MCG tablet    Sig: Take 137 mcg by mouth daily before breakfast.  . Doxylamine-Pyridoxine (DICLEGIS) 10-10 MG TBEC    Sig: 1 tab in AM, 1 tab mid afternoon 2 tabs at bedtime. Max dose 4 tabs daily.    Dispense:  100 tablet    Refill:  5   Orders Placed This Encounter  Procedures  . Culture, OB Urine  . SureSwab, Vaginosis/Vaginitis Plus  . Obstetric panel  . HIV antibody  . Hemoglobinopathy evaluation  . Varicella zoster antibody, IgG  . VITAMIN D 25 Hydroxy (Vit-D Deficiency, Fractures)  . POCT urinalysis dipstick  . EKG 12-Lead    Patient may go to [facility] for baseline  EKG    Standing Status: Future     Number of Occurrences:      Standing Expiration Date: 12/27/2015    Order Specific Question:  Where should this test be performed    Answer:  Other    Follow up in 4 weeks.

## 2014-12-28 ENCOUNTER — Encounter: Payer: BLUE CROSS/BLUE SHIELD | Admitting: Obstetrics

## 2014-12-28 LAB — OBSTETRIC PANEL
Antibody Screen: NEGATIVE
BASOS ABS: 0 10*3/uL (ref 0.0–0.1)
Basophils Relative: 1 % (ref 0–1)
Eosinophils Absolute: 0.1 10*3/uL (ref 0.0–0.7)
Eosinophils Relative: 4 % (ref 0–5)
HCT: 42.9 % (ref 36.0–46.0)
HEP B S AG: NEGATIVE
Hemoglobin: 14.2 g/dL (ref 12.0–15.0)
LYMPHS ABS: 0.8 10*3/uL (ref 0.7–4.0)
LYMPHS PCT: 23 % (ref 12–46)
MCH: 30.8 pg (ref 26.0–34.0)
MCHC: 33.1 g/dL (ref 30.0–36.0)
MCV: 93.1 fL (ref 78.0–100.0)
MPV: 13.6 fL — AB (ref 8.6–12.4)
Monocytes Absolute: 0.5 10*3/uL (ref 0.1–1.0)
Monocytes Relative: 15 % — ABNORMAL HIGH (ref 3–12)
NEUTROS ABS: 2 10*3/uL (ref 1.7–7.7)
NEUTROS PCT: 57 % (ref 43–77)
PLATELETS: 311 10*3/uL (ref 150–400)
RBC: 4.61 MIL/uL (ref 3.87–5.11)
RDW: 14 % (ref 11.5–15.5)
RUBELLA: 4.68 {index} — AB (ref <0.90)
Rh Type: POSITIVE
WBC: 3.5 10*3/uL — ABNORMAL LOW (ref 4.0–10.5)

## 2014-12-28 LAB — VITAMIN D 25 HYDROXY (VIT D DEFICIENCY, FRACTURES): VIT D 25 HYDROXY: 29 ng/mL — AB (ref 30–100)

## 2014-12-28 LAB — HIV ANTIBODY (ROUTINE TESTING W REFLEX): HIV: NONREACTIVE

## 2014-12-28 LAB — VARICELLA ZOSTER ANTIBODY, IGG: VARICELLA IGG: 2063 {index} — AB (ref <135.00)

## 2014-12-29 ENCOUNTER — Other Ambulatory Visit: Payer: Self-pay | Admitting: Obstetrics

## 2014-12-29 DIAGNOSIS — O2341 Unspecified infection of urinary tract in pregnancy, first trimester: Principal | ICD-10-CM

## 2014-12-29 DIAGNOSIS — B955 Unspecified streptococcus as the cause of diseases classified elsewhere: Secondary | ICD-10-CM

## 2014-12-29 LAB — HEMOGLOBINOPATHY EVALUATION
Hemoglobin Other: 0 %
Hgb A2 Quant: 2.5 % (ref 2.2–3.2)
Hgb A: 97.5 % (ref 96.8–97.8)
Hgb F Quant: 0 % (ref 0.0–2.0)
Hgb S Quant: 0 %

## 2014-12-29 LAB — CULTURE, OB URINE

## 2014-12-29 MED ORDER — AMOXICILLIN-POT CLAVULANATE 875-125 MG PO TABS: 1.0000 | ORAL_TABLET | Freq: Two times a day (BID) | ORAL | Status: DC

## 2014-12-31 LAB — SURESWAB, VAGINOSIS/VAGINITIS PLUS
ATOPOBIUM VAGINAE: 6.2 Log (cells/mL)
BV CATEGORY: UNDETERMINED — AB
C. TROPICALIS, DNA: NOT DETECTED
C. albicans, DNA: NOT DETECTED
C. glabrata, DNA: NOT DETECTED
C. parapsilosis, DNA: NOT DETECTED
C. trachomatis RNA, TMA: NOT DETECTED
LACTOBACILLUS SPECIES: 7.1 Log (cells/mL)
MEGASPHAERA SPECIES: NOT DETECTED Log (cells/mL)
N. gonorrhoeae RNA, TMA: NOT DETECTED
T. VAGINALIS RNA, QL TMA: NOT DETECTED

## 2015-01-01 ENCOUNTER — Other Ambulatory Visit: Payer: Self-pay | Admitting: Obstetrics

## 2015-01-01 DIAGNOSIS — B9689 Other specified bacterial agents as the cause of diseases classified elsewhere: Secondary | ICD-10-CM

## 2015-01-01 DIAGNOSIS — N760 Acute vaginitis: Principal | ICD-10-CM

## 2015-01-01 MED ORDER — TINIDAZOLE 500 MG PO TABS: 1000.0000 mg | ORAL_TABLET | Freq: Every day | ORAL | Status: DC

## 2015-01-12 ENCOUNTER — Ambulatory Visit (HOSPITAL_COMMUNITY)
Admission: RE | Admit: 2015-01-12 | Discharge: 2015-01-12 | Disposition: A | Discharge status: Home / Self Care | Payer: BLUE CROSS/BLUE SHIELD | Source: Ambulatory Visit | Attending: Obstetrics | Admitting: Obstetrics

## 2015-01-12 DIAGNOSIS — R0602 Shortness of breath: Secondary | ICD-10-CM | POA: Insufficient documentation

## 2015-01-23 ENCOUNTER — Encounter: Payer: Self-pay | Admitting: Obstetrics

## 2015-01-23 ENCOUNTER — Ambulatory Visit (INDEPENDENT_AMBULATORY_CARE_PROVIDER_SITE_OTHER): Payer: BLUE CROSS/BLUE SHIELD | Admitting: Obstetrics

## 2015-01-23 VITALS — BP 96/53 | HR 82 | Temp 98.2°F | Wt 156.0 lb

## 2015-01-23 DIAGNOSIS — Z3482 Encounter for supervision of other normal pregnancy, second trimester: Secondary | ICD-10-CM

## 2015-01-23 LAB — POCT URINALYSIS DIPSTICK
Bilirubin, UA: NEGATIVE
Glucose, UA: NEGATIVE
KETONES UA: NEGATIVE
Leukocytes, UA: NEGATIVE
Nitrite, UA: NEGATIVE
PH UA: 7
PROTEIN UA: NEGATIVE
RBC UA: NEGATIVE
SPEC GRAV UA: 1.01
UROBILINOGEN UA: NEGATIVE

## 2015-01-23 NOTE — Progress Notes (Signed)
  Subjective:    Kiara Juarez is a 30 y.o. female being seen today for her obstetrical visit. She is at [redacted]w[redacted]d gestation. Patient reports: backache.  Problem List Items Addressed This Visit    None    Visit Diagnoses    Supervision of other normal pregnancy, antepartum, second trimester    -  Primary    Relevant Orders    POCT urinalysis dipstick    AFP, Quad Screen    US OB Comp + 14 Wk      Patient Active Problem List   Diagnosis Date Noted  . S/P cesarean section 09/28/2013  . Herpes zoster 07/28/2013  . UTI (lower urinary tract infection) 03/01/2013  . Acute blood loss anemia 04/03/2012  . Tension type headache 03/11/2011  . Depression   . Hypothyroid 10/17/2006  . MOLE 04/21/2006  . GOITER NOS 04/21/2006    Objective:     BP 96/53 mmHg  Pulse 82  Temp(Src) 98.2 F (36.8 C)  Wt 156 lb (70.761 kg) Uterine Size: Below umbilicus     Assessment:    Pregnancy @ [redacted]w[redacted]d  weeks Doing well    Plan:    Problem list reviewed and updated. Labs reviewed.  Follow up in 4 weeks. FIRST/CF mutation testing/NIPT/QUAD SCREEN/fragile X/Ashkenazi Jewish population testing/Spinal muscular atrophy discussed: requested. Role of ultrasound in pregnancy discussed; fetal survey: requested. Amniocentesis discussed: not indicated.

## 2015-01-24 ENCOUNTER — Encounter: Payer: BLUE CROSS/BLUE SHIELD | Admitting: Obstetrics

## 2015-01-26 LAB — AFP, QUAD SCREEN
AFP: 27.9 ng/mL
Age Alone: 1:559 {titer}
CURR GEST AGE: 15 wks.days
Down Syndrome Scr Risk Est: 1:24400 {titer}
HCG, Total: 20.12 IU/mL
INH: 164.3 pg/mL
Interpretation-AFP: NEGATIVE
MOM FOR HCG: 0.49
MOM FOR INH: 1.1
MoM for AFP: 1.1
Open Spina bifida: NEGATIVE
TRI 18 SCR RISK EST: NEGATIVE
Trisomy 18 (Edward) Syndrome Interp.: 1:12900 {titer}
UE3 MOM: 1.06
uE3 Value: 0.81 ng/mL

## 2015-02-07 ENCOUNTER — Other Ambulatory Visit: Payer: Self-pay | Admitting: Certified Nurse Midwife

## 2015-02-20 MED FILL — SYNTHROID 137 MCG TABLET: 137 | 90 days supply | Qty: 90 | Fill #0

## 2015-02-21 MED FILL — SYNTHROID 150 MCG TABLET: 150 | 30 days supply | Qty: 30 | Fill #0

## 2015-02-22 ENCOUNTER — Ambulatory Visit (INDEPENDENT_AMBULATORY_CARE_PROVIDER_SITE_OTHER): Payer: BLUE CROSS/BLUE SHIELD | Admitting: Obstetrics

## 2015-02-22 ENCOUNTER — Ambulatory Visit (INDEPENDENT_AMBULATORY_CARE_PROVIDER_SITE_OTHER): Payer: BLUE CROSS/BLUE SHIELD

## 2015-02-22 VITALS — BP 110/72 | HR 87 | Temp 98.2°F | Wt 163.0 lb

## 2015-02-22 DIAGNOSIS — Z36 Encounter for antenatal screening of mother: Secondary | ICD-10-CM

## 2015-02-22 DIAGNOSIS — Z3482 Encounter for supervision of other normal pregnancy, second trimester: Secondary | ICD-10-CM

## 2015-02-22 DIAGNOSIS — G44019 Episodic cluster headache, not intractable: Secondary | ICD-10-CM

## 2015-02-22 DIAGNOSIS — IMO0002 Reserved for concepts with insufficient information to code with codable children: Secondary | ICD-10-CM

## 2015-02-22 DIAGNOSIS — O283 Abnormal ultrasonic finding on antenatal screening of mother: Secondary | ICD-10-CM

## 2015-02-22 MED ORDER — BUTALBITAL-APAP-CAFFEINE 50-325-40 MG PO TABS: 2.0000 | ORAL_TABLET | Freq: Four times a day (QID) | ORAL | Status: DC | PRN

## 2015-02-23 ENCOUNTER — Encounter: Payer: Self-pay | Admitting: Obstetrics

## 2015-02-23 NOTE — Progress Notes (Signed)
Subjective:    Kiara Juarez is a 30 y.o. female being seen today for her obstetrical visit. She is at [redacted]w[redacted]d gestation. Patient reports: no complaints . Fetal movement: normal.  Problem List Items Addressed This Visit    None    Visit Diagnoses    Supervision of other normal pregnancy, antepartum, second trimester    -  Primary    Fetal echogenic intracardiac focus on prenatal ultrasound        Relevant Orders    [place] MFM OB COMP + 14 WK    Marginal insertion of umbilical cord        Relevant Orders    [place] MFM OB COMP + 14 WK    Episodic cluster headache, not intractable        Relevant Medications    butalbital-acetaminophen-caffeine (FIORICET) 50-325-40 MG tablet      Patient Active Problem List   Diagnosis Date Noted  . S/P cesarean section 09/28/2013  . Herpes zoster 07/28/2013  . UTI (lower urinary tract infection) 03/01/2013  . Acute blood loss anemia 04/03/2012  . Tension type headache 03/11/2011  . Depression   . Hypothyroid 10/17/2006  . MOLE 04/21/2006  . GOITER NOS 04/21/2006   Objective:    BP 110/72 mmHg  Pulse 87  Temp(Src) 98.2 F (36.8 C)  Wt 163 lb (73.936 kg) FHT: 160 BPM  Uterine Size: size equals dates     Assessment:    Pregnancy @ [redacted]w[redacted]d    Plan:    Signs and symptoms of preterm labor: discussed.  Labs, problem list reviewed and updated 2 hr GTT planned Follow up in 4 weeks.

## 2015-03-02 ENCOUNTER — Encounter (HOSPITAL_COMMUNITY): Payer: Self-pay

## 2015-03-02 ENCOUNTER — Ambulatory Visit (HOSPITAL_COMMUNITY)
Admission: RE | Admit: 2015-03-02 | Discharge: 2015-03-02 | Disposition: A | Discharge status: Home / Self Care | Payer: BLUE CROSS/BLUE SHIELD | Source: Ambulatory Visit | Attending: Obstetrics | Admitting: Obstetrics

## 2015-03-02 ENCOUNTER — Other Ambulatory Visit: Payer: Self-pay | Admitting: Obstetrics

## 2015-03-02 DIAGNOSIS — Z3A20 20 weeks gestation of pregnancy: Secondary | ICD-10-CM | POA: Diagnosis not present

## 2015-03-02 DIAGNOSIS — O43192 Other malformation of placenta, second trimester: Secondary | ICD-10-CM | POA: Diagnosis not present

## 2015-03-02 DIAGNOSIS — O358XX0 Maternal care for other (suspected) fetal abnormality and damage, not applicable or unspecified: Secondary | ICD-10-CM | POA: Diagnosis not present

## 2015-03-02 DIAGNOSIS — O283 Abnormal ultrasonic finding on antenatal screening of mother: Secondary | ICD-10-CM

## 2015-03-02 DIAGNOSIS — Z36 Encounter for antenatal screening of mother: Secondary | ICD-10-CM | POA: Insufficient documentation

## 2015-03-02 DIAGNOSIS — IMO0002 Reserved for concepts with insufficient information to code with codable children: Secondary | ICD-10-CM

## 2015-03-22 ENCOUNTER — Encounter: Payer: Self-pay | Admitting: Obstetrics

## 2015-03-22 ENCOUNTER — Ambulatory Visit (INDEPENDENT_AMBULATORY_CARE_PROVIDER_SITE_OTHER): Payer: BLUE CROSS/BLUE SHIELD | Admitting: Obstetrics

## 2015-03-22 VITALS — BP 94/61 | HR 77 | Temp 98.0°F | Wt 171.0 lb

## 2015-03-22 DIAGNOSIS — Z3482 Encounter for supervision of other normal pregnancy, second trimester: Secondary | ICD-10-CM

## 2015-03-22 LAB — POCT URINALYSIS DIPSTICK
Bilirubin, UA: NEGATIVE
Blood, UA: NEGATIVE
GLUCOSE UA: NEGATIVE
Ketones, UA: NEGATIVE
Leukocytes, UA: NEGATIVE
Nitrite, UA: NEGATIVE
Protein, UA: NEGATIVE
SPEC GRAV UA: 1.02
UROBILINOGEN UA: NEGATIVE
pH, UA: 6

## 2015-03-22 NOTE — Progress Notes (Signed)
Subjective:    Kiara Juarez is a 30 y.o. female being seen today for her obstetrical visit. She is at [redacted]w[redacted]d gestation. Patient reports: no complaints . Fetal movement: normal.  Problem List Items Addressed This Visit    None    Visit Diagnoses    Supervision of other normal pregnancy, antepartum, second trimester    -  Primary    Relevant Orders    POCT urinalysis dipstick      Patient Active Problem List   Diagnosis Date Noted  . S/P cesarean section 09/28/2013  . Herpes zoster 07/28/2013  . UTI (lower urinary tract infection) 03/01/2013  . Acute blood loss anemia 04/03/2012  . Tension type headache 03/11/2011  . Depression   . Hypothyroid 10/17/2006  . MOLE 04/21/2006  . GOITER NOS 04/21/2006   Objective:    BP 94/61 mmHg  Pulse 77  Temp(Src) 98 F (36.7 C)  Wt 171 lb (77.565 kg) FHT: 160 BPM  Uterine Size: size equals dates     Assessment:    Pregnancy @ [redacted]w[redacted]d    Plan:    OBGCT: ordered for next visit. Signs and symptoms of preterm labor: discussed.  Labs, problem list reviewed and updated 2 hr GTT planned Follow up in 4 weeks.

## 2015-03-27 MED FILL — SYNTHROID 150 MCG TABLET: 150 | 30 days supply | Qty: 30 | Fill #1

## 2015-03-28 ENCOUNTER — Encounter: Payer: Self-pay | Admitting: Obstetrics

## 2015-03-28 ENCOUNTER — Ambulatory Visit (INDEPENDENT_AMBULATORY_CARE_PROVIDER_SITE_OTHER): Payer: BLUE CROSS/BLUE SHIELD | Admitting: Obstetrics

## 2015-03-28 VITALS — BP 110/74 | HR 85 | Temp 98.0°F | Wt 168.0 lb

## 2015-03-28 DIAGNOSIS — Z3482 Encounter for supervision of other normal pregnancy, second trimester: Secondary | ICD-10-CM

## 2015-03-28 DIAGNOSIS — N390 Urinary tract infection, site not specified: Secondary | ICD-10-CM

## 2015-03-28 LAB — POCT URINALYSIS DIPSTICK
BILIRUBIN UA: NEGATIVE
Blood, UA: 250
GLUCOSE UA: NEGATIVE
Ketones, UA: NEGATIVE
NITRITE UA: NEGATIVE
PH UA: 6
Spec Grav, UA: 1.02
UROBILINOGEN UA: NEGATIVE

## 2015-03-28 MED ORDER — NITROFURANTOIN MONOHYD MACRO 100 MG PO CAPS: 100.0000 mg | ORAL_CAPSULE | Freq: Two times a day (BID) | ORAL | Status: DC

## 2015-03-28 MED FILL — NITROFURANTOIN MCR 100 MG C: 100 | 7 days supply | Qty: 14 | Fill #0

## 2015-03-28 NOTE — Progress Notes (Signed)
Pt is having increase lower pelvic pressure, back pain and tightening feeling.

## 2015-03-28 NOTE — Progress Notes (Signed)
Subjective:    Kiara Juarez is a 30 y.o. female being seen today for her obstetrical visit. She is at 110w1d gestation. Patient reports: abdominal tightening and cramping . Fetal movement: normal.  Problem List Items Addressed This Visit    UTI (lower urinary tract infection)   Relevant Medications   nitrofurantoin, macrocrystal-monohydrate, (MACROBID) 100 MG capsule   Other Relevant Orders   Urine culture    Other Visit Diagnoses    Encounter for supervision of other normal pregnancy in second trimester    -  Primary    Relevant Orders    POCT urinalysis dipstick (Completed)      Patient Active Problem List   Diagnosis Date Noted  . S/P cesarean section 09/28/2013  . Herpes zoster 07/28/2013  . UTI (lower urinary tract infection) 03/01/2013  . Acute blood loss anemia 04/03/2012  . Tension type headache 03/11/2011  . Depression   . Hypothyroid 10/17/2006  . MOLE 04/21/2006  . GOITER NOS 04/21/2006   Objective:    BP 110/74 mmHg  Pulse 85  Temp(Src) 98 F (36.7 C)  Wt 168 lb (76.204 kg) FHT: 160 BPM  Uterine Size: size equals dates     NST:  Reassuring.  No UC's.  Assessment:    Pregnancy @ [redacted]w[redacted]d     Threatened PTL.  Stable.  UTI  Plan:    Signs and symptoms of preterm labor: discussed.  Macrobid Rx Labs, problem list reviewed and updated 2 hr GTT planned Follow up in 1 weeks.

## 2015-03-30 LAB — URINE CULTURE

## 2015-04-04 ENCOUNTER — Ambulatory Visit (INDEPENDENT_AMBULATORY_CARE_PROVIDER_SITE_OTHER): Payer: BLUE CROSS/BLUE SHIELD | Admitting: Obstetrics

## 2015-04-04 VITALS — BP 99/66 | HR 85 | Temp 98.4°F | Wt 171.0 lb

## 2015-04-04 DIAGNOSIS — Z3492 Encounter for supervision of normal pregnancy, unspecified, second trimester: Secondary | ICD-10-CM

## 2015-04-04 LAB — POCT URINALYSIS DIPSTICK
BILIRUBIN UA: NEGATIVE
Blood, UA: NEGATIVE
Glucose, UA: NEGATIVE
KETONES UA: NEGATIVE
NITRITE UA: NEGATIVE
PH UA: 7
Spec Grav, UA: 1.02
Urobilinogen, UA: NEGATIVE

## 2015-04-04 NOTE — Progress Notes (Signed)
Patient is feeling better. She is concerned about the amount of driving she is doing and may need to cut bac the closer she gets to delivery.

## 2015-04-05 ENCOUNTER — Encounter: Payer: Self-pay | Admitting: Obstetrics

## 2015-04-05 NOTE — Progress Notes (Signed)
Subjective:    Kiara Juarez is a 30 y.o. female being seen today for her obstetrical visit. She is at [redacted]w[redacted]d gestation. Patient reports: no complaints . Fetal movement: normal.  Problem List Items Addressed This Visit    None    Visit Diagnoses    Prenatal care, second trimester    -  Primary    Relevant Orders    POCT urinalysis dipstick (Completed)      Patient Active Problem List   Diagnosis Date Noted  . S/P cesarean section 09/28/2013  . Herpes zoster 07/28/2013  . UTI (lower urinary tract infection) 03/01/2013  . Acute blood loss anemia 04/03/2012  . Tension type headache 03/11/2011  . Depression   . Hypothyroid 10/17/2006  . MOLE 04/21/2006  . GOITER NOS 04/21/2006   Objective:    BP 99/66 mmHg  Pulse 85  Temp(Src) 98.4 F (36.9 C)  Wt 171 lb (77.565 kg) FHT: 160 BPM  Uterine Size: size equals dates     Assessment:    Pregnancy @ 107w2d    Plan:    OBGCT: ordered for next visit. Signs and symptoms of preterm labor: discussed.  Labs, problem list reviewed and updated 2 hr GTT planned Follow up in 2 weeks.

## 2015-04-12 ENCOUNTER — Ambulatory Visit (INDEPENDENT_AMBULATORY_CARE_PROVIDER_SITE_OTHER): Payer: BLUE CROSS/BLUE SHIELD | Admitting: Obstetrics

## 2015-04-12 ENCOUNTER — Encounter: Payer: Self-pay | Admitting: Obstetrics

## 2015-04-12 VITALS — BP 106/70 | HR 103 | Temp 99.3°F | Wt 171.0 lb

## 2015-04-12 DIAGNOSIS — R059 Cough, unspecified: Secondary | ICD-10-CM

## 2015-04-12 DIAGNOSIS — Z331 Pregnant state, incidental: Secondary | ICD-10-CM | POA: Diagnosis not present

## 2015-04-12 DIAGNOSIS — Z1389 Encounter for screening for other disorder: Secondary | ICD-10-CM

## 2015-04-12 DIAGNOSIS — R05 Cough: Secondary | ICD-10-CM

## 2015-04-12 DIAGNOSIS — J111 Influenza due to unidentified influenza virus with other respiratory manifestations: Secondary | ICD-10-CM

## 2015-04-12 DIAGNOSIS — Z3483 Encounter for supervision of other normal pregnancy, third trimester: Secondary | ICD-10-CM

## 2015-04-12 LAB — POCT URINALYSIS DIPSTICK
Bilirubin, UA: NEGATIVE
GLUCOSE UA: NEGATIVE
Ketones, UA: NEGATIVE
Leukocytes, UA: NEGATIVE
NITRITE UA: NEGATIVE
PH UA: 6
PROTEIN UA: NEGATIVE
RBC UA: NEGATIVE
SPEC GRAV UA: 1.025
UROBILINOGEN UA: NEGATIVE

## 2015-04-12 MED ORDER — OSELTAMIVIR PHOSPHATE 75 MG PO CAPS: 75.0000 mg | ORAL_CAPSULE | Freq: Two times a day (BID) | ORAL | Status: DC

## 2015-04-12 MED ORDER — AZITHROMYCIN 250 MG PO TABS: ORAL_TABLET | ORAL | Status: DC

## 2015-04-12 MED ORDER — GUAIFENESIN-CODEINE 100-10 MG/5ML PO SYRP: 10.0000 mL | ORAL_SOLUTION | Freq: Four times a day (QID) | ORAL | Status: DC | PRN

## 2015-04-12 MED FILL — AZITHROMYCIN 250 MG TABLET: 250 | 5 days supply | Qty: 6 | Fill #0

## 2015-04-12 MED FILL — GUAIATUSSIN AC LIQUID: 100-10 | 11 days supply | Qty: 473 | Fill #0

## 2015-04-12 MED FILL — OSELTAMIVIR PHOS 75 MG CAP: 75 | 5 days supply | Qty: 10 | Fill #0

## 2015-04-12 NOTE — Progress Notes (Signed)
Subjective:    Kiara Juarez is a 30 y.o. female being seen today for her obstetrical visit. She is at [redacted]w[redacted]d gestation. Patient reports: Runny nose, congestion, fever and cough for 2 days. . Fetal movement: normal.  Problem List Items Addressed This Visit    None    Visit Diagnoses    Flu syndrome    -  Primary    Relevant Medications    azithromycin (ZITHROMAX Z-PAK) 250 MG tablet    oseltamivir (TAMIFLU) 75 MG capsule    Cough        Relevant Medications    guaiFENesin-codeine (ROBITUSSIN AC) 100-10 MG/5ML syrup    Supervision of other normal pregnancy, antepartum, second trimester        Relevant Orders    POCT urinalysis dipstick (Completed)      Patient Active Problem List   Diagnosis Date Noted  . S/P cesarean section 09/28/2013  . Herpes zoster 07/28/2013  . UTI (lower urinary tract infection) 03/01/2013  . Acute blood loss anemia 04/03/2012  . Tension type headache 03/11/2011  . Depression   . Hypothyroid 10/17/2006  . MOLE 04/21/2006  . GOITER NOS 04/21/2006   Objective:    BP 106/70 mmHg  Pulse 103  Temp(Src) 99.3 F (37.4 C)  Wt 171 lb (77.565 kg) FHT: 160 BPM  Uterine Size: size equals dates     Assessment:    Pregnancy @ [redacted]w[redacted]d     Flu  Plan:   Tamiflu Rx Z-Pak Rx Robitussin AC Rx Comfort measures recommended:  Rest, Fluids, Chicken Soup, Emergen - C    Signs and symptoms of preterm labor: discussed.  Labs, problem list reviewed and updated 2 hr GTT planned Follow up in 2 weeks.

## 2015-04-19 ENCOUNTER — Other Ambulatory Visit: Payer: BLUE CROSS/BLUE SHIELD

## 2015-04-19 ENCOUNTER — Encounter: Payer: BLUE CROSS/BLUE SHIELD | Admitting: Obstetrics

## 2015-04-19 MED FILL — SYNTHROID 150 MCG TABLET: 150 | 30 days supply | Qty: 30 | Fill #2

## 2015-04-24 ENCOUNTER — Other Ambulatory Visit: Payer: Self-pay | Admitting: Certified Nurse Midwife

## 2015-04-25 ENCOUNTER — Ambulatory Visit (INDEPENDENT_AMBULATORY_CARE_PROVIDER_SITE_OTHER): Payer: BLUE CROSS/BLUE SHIELD | Admitting: Obstetrics

## 2015-04-25 ENCOUNTER — Other Ambulatory Visit: Payer: BLUE CROSS/BLUE SHIELD

## 2015-04-25 VITALS — BP 106/61 | HR 77 | Wt 176.0 lb

## 2015-04-25 DIAGNOSIS — Z3493 Encounter for supervision of normal pregnancy, unspecified, third trimester: Secondary | ICD-10-CM

## 2015-04-25 LAB — POCT URINALYSIS DIPSTICK
Bilirubin, UA: NEGATIVE
Blood, UA: NEGATIVE
Glucose, UA: NEGATIVE
Ketones, UA: NEGATIVE
Leukocytes, UA: NEGATIVE
Nitrite, UA: NEGATIVE
PH UA: 6.5
Protein, UA: NEGATIVE
Spec Grav, UA: <=1.005
UROBILINOGEN UA: NEGATIVE

## 2015-04-25 NOTE — Progress Notes (Signed)
Patient is having back pain. 

## 2015-04-26 ENCOUNTER — Encounter: Payer: Self-pay | Admitting: Obstetrics

## 2015-04-26 LAB — CBC
HEMATOCRIT: 37.7 % (ref 34.0–46.6)
HEMOGLOBIN: 12.7 g/dL (ref 11.1–15.9)
MCH: 31.8 pg (ref 26.6–33.0)
MCHC: 33.7 g/dL (ref 31.5–35.7)
MCV: 95 fL (ref 79–97)
Platelets: 327 10*3/uL (ref 150–379)
RBC: 3.99 x10E6/uL (ref 3.77–5.28)
RDW: 13.6 % (ref 12.3–15.4)
WBC: 8.4 10*3/uL (ref 3.4–10.8)

## 2015-04-26 LAB — GLUCOSE TOLERANCE, 2 HOURS W/ 1HR
GLUCOSE, 2 HOUR: 69 mg/dL (ref 65–152)
Glucose, 1 hour: 81 mg/dL (ref 65–179)
Glucose, Fasting: 66 mg/dL (ref 65–91)

## 2015-04-26 LAB — RPR: RPR: NONREACTIVE

## 2015-04-26 LAB — HIV ANTIBODY (ROUTINE TESTING W REFLEX): HIV Screen 4th Generation wRfx: NONREACTIVE

## 2015-04-26 NOTE — Progress Notes (Signed)
Subjective:    Kiara Juarez is a 30 y.o. female being seen today for her obstetrical visit. She is at [redacted]w[redacted]d gestation. Patient reports no complaints. Fetal movement: normal.  Problem List Items Addressed This Visit    None    Visit Diagnoses    Prenatal care, third trimester    -  Primary    Relevant Orders    POCT urinalysis dipstick (Completed)    Glucose Tolerance, 2 Hours w/1 Hour (Completed)    CBC (Completed)    HIV antibody (Completed)    RPR (Completed)      Patient Active Problem List   Diagnosis Date Noted  . S/P cesarean section 09/28/2013  . Herpes zoster 07/28/2013  . UTI (lower urinary tract infection) 03/01/2013  . Acute blood loss anemia 04/03/2012  . Tension type headache 03/11/2011  . Depression   . Hypothyroid 10/17/2006  . MOLE 04/21/2006  . GOITER NOS 04/21/2006   Objective:    BP 106/61 mmHg  Pulse 77  Wt 176 lb (79.833 kg) FHT:  160 BPM  Uterine Size: size equals dates  Presentation: unsure     Assessment:    Pregnancy @ [redacted]w[redacted]d weeks   Plan:     labs reviewed, problem list updated Consent signed. GBS sent TDAP offered  Rhogam given for RH negative Pediatrician: discussed. Infant feeding: plans to breastfeed. Maternity leave: discussed. Cigarette smoking: former smoker. Orders Placed This Encounter  Procedures  . Glucose Tolerance, 2 Hours w/1 Hour  . CBC  . HIV antibody  . RPR  . POCT urinalysis dipstick   No orders of the defined types were placed in this encounter.   Follow up in 2 Weeks.

## 2015-04-26 NOTE — Progress Notes (Signed)
Lab only 

## 2015-05-09 ENCOUNTER — Ambulatory Visit (INDEPENDENT_AMBULATORY_CARE_PROVIDER_SITE_OTHER): Payer: BLUE CROSS/BLUE SHIELD | Admitting: Obstetrics

## 2015-05-09 ENCOUNTER — Encounter: Payer: Self-pay | Admitting: Obstetrics

## 2015-05-09 VITALS — BP 98/59 | HR 83 | Temp 98.3°F | Wt 178.0 lb

## 2015-05-09 DIAGNOSIS — IMO0002 Reserved for concepts with insufficient information to code with codable children: Secondary | ICD-10-CM

## 2015-05-09 DIAGNOSIS — Z3493 Encounter for supervision of normal pregnancy, unspecified, third trimester: Secondary | ICD-10-CM

## 2015-05-09 LAB — POCT URINALYSIS DIPSTICK
BILIRUBIN UA: NEGATIVE
Blood, UA: 50
GLUCOSE UA: NEGATIVE
KETONES UA: NEGATIVE
Leukocytes, UA: NEGATIVE
NITRITE UA: NEGATIVE
PH UA: 6
Protein, UA: NEGATIVE
SPEC GRAV UA: 1.015
Urobilinogen, UA: NEGATIVE

## 2015-05-09 NOTE — Progress Notes (Signed)
Patient has no concerns 

## 2015-05-09 NOTE — Progress Notes (Signed)
Subjective:    Kiara Juarez is a 30 y.o. female being seen today for her obstetrical visit. She is at [redacted]w[redacted]d gestation. Patient reports no complaints. Fetal movement: normal.  Problem List Items Addressed This Visit    None    Visit Diagnoses    LGA (large for gestational age) fetus    -  Primary    Relevant Orders    US OB Comp + 14 Wk    US OB Follow Up    [place] [facility] OB FOLLOW UP    Prenatal care, third trimester        Relevant Orders    POCT urinalysis dipstick (Completed)      Patient Active Problem List   Diagnosis Date Noted  . S/P cesarean section 09/28/2013  . Herpes zoster 07/28/2013  . UTI (lower urinary tract infection) 03/01/2013  . Acute blood loss anemia 04/03/2012  . Tension type headache 03/11/2011  . Depression   . Hypothyroid 10/17/2006  . MOLE 04/21/2006  . GOITER NOS 04/21/2006   Objective:    BP 98/59 mmHg  Pulse 83  Temp(Src) 98.3 F (36.8 C)  Wt 178 lb (80.74 kg) FHT:  160 BPM  Uterine Size: size greater than dates  Presentation: unsure     Assessment:    Pregnancy @ [redacted]w[redacted]d weeks    LGA  Plan:   Ultrasound ordered   labs reviewed, problem list updated Consent signed. GBS sent TDAP offered  Rhogam given for RH negative Pediatrician: discussed. Infant feeding: plans to breastfeed. Maternity leave: discussed. Cigarette smoking: former smoker.  Orders Placed This Encounter  Procedures  . US OB Comp + 14 Wk    Standing Status: Future     Number of Occurrences:      Standing Expiration Date: 07/08/2016    Order Specific Question:  Reason for Exam (SYMPTOM  OR DIAGNOSIS REQUIRED)    Answer:  size > dates    Order Specific Question:  Preferred imaging location?    Answer:  Internal  . US OB Follow Up    Standing Status: Future     Number of Occurrences:      Standing Expiration Date: 07/08/2016    Order Specific Question:  Reason for Exam (SYMPTOM  OR DIAGNOSIS REQUIRED)    Answer:  size > dates    Order Specific Question:   Preferred imaging location?    Answer:  Internal  . [place] [facility] OB FOLLOW UP    Standing Status: Future     Number of Occurrences:      Standing Expiration Date: 07/08/2016    Order Specific Question:  Reason for Exam (SYMPTOM  OR DIAGNOSIS REQUIRED)    Answer:  LGA    Order Specific Question:  Preferred imaging location?    Answer:  [facility]  . POCT urinalysis dipstick   No orders of the defined types were placed in this encounter.   Follow up in 2 Weeks.

## 2015-05-14 ENCOUNTER — Ambulatory Visit (HOSPITAL_COMMUNITY)
Admission: RE | Admit: 2015-05-14 | Discharge: 2015-05-14 | Disposition: A | Discharge status: Home / Self Care | Payer: BLUE CROSS/BLUE SHIELD | Source: Ambulatory Visit | Attending: Obstetrics | Admitting: Obstetrics

## 2015-05-14 DIAGNOSIS — Z3A30 30 weeks gestation of pregnancy: Secondary | ICD-10-CM | POA: Diagnosis not present

## 2015-05-14 DIAGNOSIS — IMO0002 Reserved for concepts with insufficient information to code with codable children: Secondary | ICD-10-CM

## 2015-05-14 DIAGNOSIS — O26843 Uterine size-date discrepancy, third trimester: Secondary | ICD-10-CM | POA: Diagnosis not present

## 2015-05-21 ENCOUNTER — Encounter (HOSPITAL_COMMUNITY): Payer: Self-pay | Admitting: *Deleted

## 2015-05-21 ENCOUNTER — Emergency Department (HOSPITAL_COMMUNITY): Payer: BLUE CROSS/BLUE SHIELD

## 2015-05-21 ENCOUNTER — Emergency Department (HOSPITAL_COMMUNITY)
Admission: EM | Admit: 2015-05-21 | Discharge: 2015-05-21 | Disposition: A | Discharge status: Home / Self Care | Payer: BLUE CROSS/BLUE SHIELD | Attending: Emergency Medicine | Admitting: Emergency Medicine

## 2015-05-21 DIAGNOSIS — S3991XA Unspecified injury of abdomen, initial encounter: Secondary | ICD-10-CM | POA: Diagnosis not present

## 2015-05-21 DIAGNOSIS — S0990XA Unspecified injury of head, initial encounter: Secondary | ICD-10-CM | POA: Diagnosis not present

## 2015-05-21 DIAGNOSIS — S161XXA Strain of muscle, fascia and tendon at neck level, initial encounter: Secondary | ICD-10-CM

## 2015-05-21 DIAGNOSIS — Y998 Other external cause status: Secondary | ICD-10-CM | POA: Insufficient documentation

## 2015-05-21 DIAGNOSIS — Y92410 Unspecified street and highway as the place of occurrence of the external cause: Secondary | ICD-10-CM | POA: Diagnosis not present

## 2015-05-21 DIAGNOSIS — S3992XA Unspecified injury of lower back, initial encounter: Secondary | ICD-10-CM | POA: Insufficient documentation

## 2015-05-21 DIAGNOSIS — Y9389 Activity, other specified: Secondary | ICD-10-CM | POA: Diagnosis not present

## 2015-05-21 DIAGNOSIS — Z3A32 32 weeks gestation of pregnancy: Secondary | ICD-10-CM | POA: Insufficient documentation

## 2015-05-21 DIAGNOSIS — O9A213 Injury, poisoning and certain other consequences of external causes complicating pregnancy, third trimester: Secondary | ICD-10-CM | POA: Insufficient documentation

## 2015-05-21 DIAGNOSIS — Z8639 Personal history of other endocrine, nutritional and metabolic disease: Secondary | ICD-10-CM | POA: Insufficient documentation

## 2015-05-21 HISTORY — DX: Disorder of thyroid, unspecified: E07.9

## 2015-05-21 LAB — CBC WITH DIFFERENTIAL/PLATELET
BASOS ABS: 0 10*3/uL (ref 0.0–0.1)
BASOS PCT: 0 %
EOS ABS: 0 10*3/uL (ref 0.0–0.7)
Eosinophils Relative: 0 %
HEMATOCRIT: 36.9 % (ref 36.0–46.0)
HEMOGLOBIN: 12.3 g/dL (ref 12.0–15.0)
Lymphocytes Relative: 14 %
Lymphs Abs: 1.1 10*3/uL (ref 0.7–4.0)
MCH: 30.7 pg (ref 26.0–34.0)
MCHC: 33.3 g/dL (ref 30.0–36.0)
MCV: 92 fL (ref 78.0–100.0)
Monocytes Absolute: 0.5 10*3/uL (ref 0.1–1.0)
Monocytes Relative: 6 %
NEUTROS ABS: 5.8 10*3/uL (ref 1.7–7.7)
NEUTROS PCT: 80 %
Platelets: 253 10*3/uL (ref 150–400)
RBC: 4.01 MIL/uL (ref 3.87–5.11)
RDW: 12.9 % (ref 11.5–15.5)
WBC: 7.4 10*3/uL (ref 4.0–10.5)

## 2015-05-21 LAB — URINALYSIS, ROUTINE W REFLEX MICROSCOPIC
BILIRUBIN URINE: NEGATIVE
Glucose, UA: NEGATIVE mg/dL
HGB URINE DIPSTICK: NEGATIVE
Ketones, ur: NEGATIVE mg/dL
LEUKOCYTES UA: NEGATIVE
NITRITE: NEGATIVE
PH: 7 (ref 5.0–8.0)
Protein, ur: NEGATIVE mg/dL
Specific Gravity, Urine: 1.02 (ref 1.005–1.030)

## 2015-05-21 LAB — BASIC METABOLIC PANEL WITH GFR
Anion gap: 9 (ref 5–15)
BUN: 5 mg/dL — ABNORMAL LOW (ref 6–20)
CO2: 23 mmol/L (ref 22–32)
Calcium: 8.8 mg/dL — ABNORMAL LOW (ref 8.9–10.3)
Chloride: 107 mmol/L (ref 101–111)
Creatinine, Ser: 0.56 mg/dL (ref 0.44–1.00)
GFR calc Af Amer: >60 mL/min
GFR calc non Af Amer: >60 mL/min
Glucose, Bld: 81 mg/dL (ref 65–99)
Potassium: 3.4 mmol/L — ABNORMAL LOW (ref 3.5–5.1)
Sodium: 139 mmol/L (ref 135–145)

## 2015-05-21 MED ORDER — SODIUM CHLORIDE 0.9 % IV SOLN
INTRAVENOUS | Status: DC
  Administered 2015-05-21: 125 mL/h via INTRAVENOUS

## 2015-05-21 MED ORDER — ACETAMINOPHEN 500 MG PO TABS
1000.0000 mg | ORAL_TABLET | Freq: Once | ORAL | Status: AC
  Administered 2015-05-21: 1000 mg via ORAL
  Filled 2015-05-21: qty 2
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
## 2015-05-21 NOTE — ED Notes (Signed)
Patient transported to CT.  Family at bedside.  Pt denies abd pain, but continues to c/o LL back pain and cervical neck pain.

## 2015-05-21 NOTE — ED Notes (Addendum)
OB RR arrived.

## 2015-05-22 ENCOUNTER — Encounter: Payer: Self-pay | Admitting: Obstetrics

## 2015-05-22 ENCOUNTER — Encounter (HOSPITAL_COMMUNITY): Payer: Self-pay

## 2015-05-23 ENCOUNTER — Encounter: Payer: Self-pay | Admitting: *Deleted

## 2015-05-23 ENCOUNTER — Ambulatory Visit (INDEPENDENT_AMBULATORY_CARE_PROVIDER_SITE_OTHER): Payer: BLUE CROSS/BLUE SHIELD | Admitting: Obstetrics

## 2015-05-23 VITALS — BP 105/59 | HR 94 | Temp 99.4°F | Wt 180.0 lb

## 2015-05-23 DIAGNOSIS — Z3493 Encounter for supervision of normal pregnancy, unspecified, third trimester: Secondary | ICD-10-CM

## 2015-05-23 DIAGNOSIS — M62838 Other muscle spasm: Secondary | ICD-10-CM

## 2015-05-23 LAB — POCT URINALYSIS DIPSTICK
BILIRUBIN UA: NEGATIVE
Glucose, UA: NEGATIVE
KETONES UA: NEGATIVE
Leukocytes, UA: NEGATIVE
NITRITE UA: NEGATIVE
PH UA: 6
Protein, UA: NEGATIVE
RBC UA: NEGATIVE
SPEC GRAV UA: 1.02
Urobilinogen, UA: NEGATIVE

## 2015-05-23 MED ORDER — CYCLOBENZAPRINE HCL 10 MG PO TABS: 10.0000 mg | ORAL_TABLET | Freq: Three times a day (TID) | ORAL | Status: DC

## 2015-05-23 MED FILL — CYCLOBENZAPRINE 10 MG TAB: 10 | 14 days supply | Qty: 42 | Fill #0

## 2015-05-23 NOTE — Progress Notes (Signed)
Patient has no questions or concerns today.

## 2015-05-24 ENCOUNTER — Encounter: Payer: Self-pay | Admitting: Obstetrics
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
## 2015-05-25 MED FILL — SYNTHROID 150 MCG TABLET: 150 | 30 days supply | Qty: 30 | Fill #3

## 2015-05-30 ENCOUNTER — Encounter: Payer: Self-pay | Admitting: Obstetrics

## 2015-05-30 ENCOUNTER — Ambulatory Visit (INDEPENDENT_AMBULATORY_CARE_PROVIDER_SITE_OTHER): Payer: BLUE CROSS/BLUE SHIELD | Admitting: Obstetrics

## 2015-05-30 VITALS — BP 100/54 | HR 83 | Temp 98.7°F | Wt 181.0 lb

## 2015-05-30 DIAGNOSIS — Z3493 Encounter for supervision of normal pregnancy, unspecified, third trimester: Secondary | ICD-10-CM

## 2015-05-30 LAB — POCT URINALYSIS DIPSTICK
BILIRUBIN UA: NEGATIVE
Glucose, UA: NEGATIVE
Ketones, UA: NEGATIVE
LEUKOCYTES UA: NEGATIVE
NITRITE UA: NEGATIVE
PH UA: 6
PROTEIN UA: NEGATIVE
RBC UA: NEGATIVE
Spec Grav, UA: 1.015
Urobilinogen, UA: NEGATIVE

## 2015-05-30 NOTE — Progress Notes (Signed)
Subjective:    Kiara Juarez is a 30 y.o. female being seen today for her obstetrical visit. She is at [redacted]w[redacted]d gestation. Patient reports no complaints. Fetal movement: normal.  Problem List Items Addressed This Visit    None    Visit Diagnoses    Prenatal care in third trimester    -  Primary    Relevant Orders    POCT Urinalysis Dipstick (Completed)      Patient Active Problem List   Diagnosis Date Noted  . S/P cesarean section 09/28/2013  . Herpes zoster 07/28/2013  . UTI (lower urinary tract infection) 03/01/2013  . Acute blood loss anemia 04/03/2012  . Tension type headache 03/11/2011  . Depression   . Hypothyroid 10/17/2006  . MOLE 04/21/2006  . GOITER NOS 04/21/2006   Objective:    BP 100/54 mmHg  Pulse 83  Temp(Src) 98.7 F (37.1 C)  Wt 181 lb (82.101 kg) FHT:  130 BPM  Uterine Size: size equals dates  Presentation: unsure     Assessment:    Pregnancy @ [redacted]w[redacted]d weeks   Plan:     labs reviewed, problem list updated Consent signed. GBS sent TDAP offered  Rhogam given for RH negative Pediatrician: discussed. Infant feeding: plans to breastfeed. Maternity leave: discussed.  Orders Placed This Encounter  Procedures  . POCT Urinalysis Dipstick   No orders of the defined types were placed in this encounter.   Follow up in 2 Weeks.

## 2015-06-06 ENCOUNTER — Encounter: Payer: BLUE CROSS/BLUE SHIELD | Admitting: Obstetrics

## 2015-06-13 ENCOUNTER — Encounter: Payer: Self-pay | Admitting: Obstetrics

## 2015-06-13 ENCOUNTER — Ambulatory Visit (INDEPENDENT_AMBULATORY_CARE_PROVIDER_SITE_OTHER): Payer: BLUE CROSS/BLUE SHIELD | Admitting: Obstetrics

## 2015-06-13 VITALS — BP 107/67 | HR 85 | Wt 185.0 lb

## 2015-06-13 DIAGNOSIS — Z3483 Encounter for supervision of other normal pregnancy, third trimester: Secondary | ICD-10-CM

## 2015-06-13 LAB — POCT URINALYSIS DIPSTICK
BILIRUBIN UA: NEGATIVE
Glucose, UA: NEGATIVE
Ketones, UA: NEGATIVE
Leukocytes, UA: NEGATIVE
NITRITE UA: NEGATIVE
PH UA: 7
Protein, UA: NEGATIVE
RBC UA: NEGATIVE
Spec Grav, UA: 1.015
UROBILINOGEN UA: NEGATIVE

## 2015-06-13 NOTE — Progress Notes (Signed)
Subjective:    Kiara Juarez is a 30 y.o. female being seen today for her obstetrical visit. She is at [redacted]w[redacted]d gestation. Patient reports no complaints. Fetal movement: normal.  Problem List Items Addressed This Visit    None    Visit Diagnoses    Encounter for supervision of other normal pregnancy in third trimester    -  Primary    Relevant Orders    POCT urinalysis dipstick (Completed)      Patient Active Problem List   Diagnosis Date Noted  . S/P cesarean section 09/28/2013  . Herpes zoster 07/28/2013  . UTI (lower urinary tract infection) 03/01/2013  . Acute blood loss anemia 04/03/2012  . Tension type headache 03/11/2011  . Depression   . Hypothyroid 10/17/2006  . MOLE 04/21/2006  . GOITER NOS 04/21/2006   Objective:    BP 107/67 mmHg  Pulse 85  Wt 185 lb (83.915 kg) FHT:  150 BPM  Uterine Size: size equals dates  Presentation: unsure     Assessment:    Pregnancy @ [redacted]w[redacted]d weeks   Plan:     labs reviewed, problem list updated Consent signed. GBS sent TDAP offered  Rhogam given for RH negative Pediatrician: discussed. Infant feeding: plans to breastfeed. Maternity leave: discussed. Cigarette smoking: not assessed. Orders Placed This Encounter  Procedures  . POCT urinalysis dipstick   No orders of the defined types were placed in this encounter.   Follow up in 1 Week.

## 2015-06-20 ENCOUNTER — Encounter: Payer: Self-pay | Admitting: Obstetrics

## 2015-06-20 ENCOUNTER — Ambulatory Visit (INDEPENDENT_AMBULATORY_CARE_PROVIDER_SITE_OTHER): Payer: BLUE CROSS/BLUE SHIELD | Admitting: Obstetrics

## 2015-06-20 VITALS — BP 94/58 | HR 81 | Wt 185.4 lb

## 2015-06-20 DIAGNOSIS — Z3493 Encounter for supervision of normal pregnancy, unspecified, third trimester: Secondary | ICD-10-CM

## 2015-06-20 LAB — POCT URINALYSIS DIPSTICK
BILIRUBIN UA: NEGATIVE
GLUCOSE UA: NEGATIVE
KETONES UA: NEGATIVE
Leukocytes, UA: NEGATIVE
Nitrite, UA: NEGATIVE
PH UA: 7
SPEC GRAV UA: 1.01
Urobilinogen, UA: NEGATIVE

## 2015-06-20 NOTE — Progress Notes (Signed)
Patient is having increasing pressure - but no pain

## 2015-06-20 NOTE — Progress Notes (Signed)
Subjective:    Kiara Juarez is a 30 y.o. female being seen today for her obstetrical visit. She is at [redacted]w[redacted]d gestation. Patient reports no complaints. Fetal movement: normal.  Problem List Items Addressed This Visit    None    Visit Diagnoses    Prenatal care, third trimester    -  Primary    Relevant Orders    POCT urinalysis dipstick (Completed)    Strep Gp B NAA      Patient Active Problem List   Diagnosis Date Noted  . S/P cesarean section 09/28/2013  . Herpes zoster 07/28/2013  . UTI (lower urinary tract infection) 03/01/2013  . Acute blood loss anemia 04/03/2012  . Tension type headache 03/11/2011  . Depression   . Hypothyroid 10/17/2006  . MOLE 04/21/2006  . GOITER NOS 04/21/2006   Objective:    BP 94/58 mmHg  Pulse 81  Wt 185 lb 6.4 oz (84.097 kg) FHT:  140 BPM  Uterine Size: size equals dates  Presentation: cephalic     Assessment:    Pregnancy @ [redacted]w[redacted]d weeks   Plan:     labs reviewed, problem list updated Consent signed. GBS sent TDAP offered  Rhogam given for RH negative Pediatrician: discussed. Infant feeding: plans to breastfeed. Maternity leave: discussed. Cigarette smoking: former smoker. Orders Placed This Encounter  Procedures  . Strep Gp B NAA  . POCT urinalysis dipstick   No orders of the defined types were placed in this encounter.   Follow up in 1 Week.

## 2015-06-21 LAB — OB RESULTS CONSOLE GBS: GBS: NEGATIVE

## 2015-06-22 LAB — STREP GP B NAA: STREP GROUP B AG: NEGATIVE

## 2015-06-27 ENCOUNTER — Encounter: Payer: Self-pay | Admitting: Obstetrics

## 2015-06-27 ENCOUNTER — Ambulatory Visit (INDEPENDENT_AMBULATORY_CARE_PROVIDER_SITE_OTHER): Payer: BLUE CROSS/BLUE SHIELD | Admitting: Obstetrics

## 2015-06-27 VITALS — BP 112/72 | HR 90 | Wt 187.0 lb

## 2015-06-27 DIAGNOSIS — Z3493 Encounter for supervision of normal pregnancy, unspecified, third trimester: Secondary | ICD-10-CM

## 2015-06-27 LAB — POCT URINALYSIS DIPSTICK
BILIRUBIN UA: NEGATIVE
GLUCOSE UA: NEGATIVE
Ketones, UA: NEGATIVE
NITRITE UA: NEGATIVE
Spec Grav, UA: 1.015
Urobilinogen, UA: NEGATIVE
pH, UA: 5.5

## 2015-06-27 MED FILL — SYNTHROID 150 MCG TABLET: 150 | 30 days supply | Qty: 30 | Fill #4

## 2015-06-27 NOTE — Progress Notes (Signed)
Subjective:    Kiara Juarez is a 30 y.o. female being seen today for her obstetrical visit. She is at [redacted]w[redacted]d gestation. Patient reports backache. Fetal movement: normal.  Problem List Items Addressed This Visit    None    Visit Diagnoses    Prenatal care, third trimester    -  Primary    Relevant Orders    POCT urinalysis dipstick (Completed)      Patient Active Problem List   Diagnosis Date Noted  . S/P cesarean section 09/28/2013  . Herpes zoster 07/28/2013  . UTI (lower urinary tract infection) 03/01/2013  . Acute blood loss anemia 04/03/2012  . Tension type headache 03/11/2011  . Depression   . Hypothyroid 10/17/2006  . MOLE 04/21/2006  . GOITER NOS 04/21/2006    Objective:    BP 112/72 mmHg  Pulse 90  Wt 187 lb (84.823 kg) FHT: 140 BPM  Uterine Size: size equals dates  Presentations: cephalic    Assessment:    Pregnancy @ [redacted]w[redacted]d weeks   Plan:   Plans for delivery: Vaginal anticipated; labs reviewed; problem list updated Counseling: Consent signed. Infant feeding: plans to breastfeed. Cigarette smoking: former smoker. L&D discussion: symptoms of labor, discussed when to call, discussed what number to call, anesthetic/analgesic options reviewed and delivering clinician:  plans physician Postpartum supports and preparation: circumcision discussed and contraception plans discussed.  Follow up in 1 Week.

## 2015-07-04 ENCOUNTER — Ambulatory Visit (INDEPENDENT_AMBULATORY_CARE_PROVIDER_SITE_OTHER): Payer: BLUE CROSS/BLUE SHIELD | Admitting: Obstetrics

## 2015-07-04 ENCOUNTER — Encounter: Payer: Self-pay | Admitting: Obstetrics

## 2015-07-04 VITALS — BP 105/60 | HR 94 | Wt 189.0 lb

## 2015-07-04 DIAGNOSIS — Z3493 Encounter for supervision of normal pregnancy, unspecified, third trimester: Secondary | ICD-10-CM

## 2015-07-04 LAB — POCT URINALYSIS DIPSTICK
Bilirubin, UA: NEGATIVE
Blood, UA: NEGATIVE
Glucose, UA: NEGATIVE
KETONES UA: NEGATIVE
Leukocytes, UA: NEGATIVE
Nitrite, UA: NEGATIVE
PH UA: 6.5
PROTEIN UA: NEGATIVE
SPEC GRAV UA: 1.015
UROBILINOGEN UA: NEGATIVE

## 2015-07-04 NOTE — Progress Notes (Signed)
189 

## 2015-07-05 ENCOUNTER — Encounter: Payer: Self-pay | Admitting: Obstetrics

## 2015-07-05 NOTE — Progress Notes (Signed)
Subjective:    Kiara Juarez is a 30 y.o. female being seen today for her obstetrical visit. She is at [redacted]w[redacted]d gestation. Patient reports no complaints. Fetal movement: normal.  Problem List Items Addressed This Visit    None    Visit Diagnoses    Prenatal care, third trimester    -  Primary    Relevant Orders    POCT urinalysis dipstick (Completed)      Patient Active Problem List   Diagnosis Date Noted  . S/P cesarean section 09/28/2013  . Herpes zoster 07/28/2013  . UTI (lower urinary tract infection) 03/01/2013  . Acute blood loss anemia 04/03/2012  . Tension type headache 03/11/2011  . Depression   . Hypothyroid 10/17/2006  . MOLE 04/21/2006  . GOITER NOS 04/21/2006    Objective:    BP 105/60 mmHg  Pulse 94  Wt 189 lb (85.73 kg) FHT: 150 BPM  Uterine Size: size equals dates  Presentations: cephalic    Assessment:    Pregnancy @ [redacted]w[redacted]d weeks   Plan:   Plans for delivery: VBAC planned; labs reviewed; problem list updated Counseling: Consent signed. Infant feeding: plans to breastfeed. Cigarette smoking: former smoker. L&D discussion: symptoms of labor, discussed when to call, discussed what number to call, anesthetic/analgesic options reviewed and delivering clinician:  plans Physician. Postpartum supports and preparation: circumcision discussed and contraception plans discussed.  Follow up in 1 Week.

## 2015-07-12 ENCOUNTER — Other Ambulatory Visit: Payer: Self-pay | Admitting: *Deleted

## 2015-07-12 ENCOUNTER — Ambulatory Visit (INDEPENDENT_AMBULATORY_CARE_PROVIDER_SITE_OTHER): Payer: BLUE CROSS/BLUE SHIELD | Admitting: Obstetrics

## 2015-07-12 ENCOUNTER — Encounter: Payer: Self-pay | Admitting: Obstetrics

## 2015-07-12 VITALS — BP 107/75 | HR 91 | Temp 98.9°F | Wt 190.0 lb

## 2015-07-12 DIAGNOSIS — Z3483 Encounter for supervision of other normal pregnancy, third trimester: Secondary | ICD-10-CM

## 2015-07-12 LAB — POCT URINALYSIS DIPSTICK
BILIRUBIN UA: NEGATIVE
Blood, UA: 50
GLUCOSE UA: NEGATIVE
KETONES UA: NEGATIVE
LEUKOCYTES UA: NEGATIVE
NITRITE UA: NEGATIVE
PH UA: 6
Protein, UA: NEGATIVE
Spec Grav, UA: 1.015
Urobilinogen, UA: NEGATIVE

## 2015-07-12 NOTE — Progress Notes (Signed)
Subjective:    Kiara Juarez is a 30 y.o. female being seen today for her obstetrical visit. She is at [redacted]w[redacted]d gestation. Patient reports occasional contractions. Fetal movement: normal.  Problem List Items Addressed This Visit    None    Visit Diagnoses    Encounter for supervision of other normal pregnancy in third trimester    -  Primary    Relevant Orders    POCT urinalysis dipstick (Completed)    Culture, OB Urine      Patient Active Problem List   Diagnosis Date Noted  . S/P cesarean section 09/28/2013  . Herpes zoster 07/28/2013  . UTI (lower urinary tract infection) 03/01/2013  . Acute blood loss anemia 04/03/2012  . Tension type headache 03/11/2011  . Depression   . Hypothyroid 10/17/2006  . MOLE 04/21/2006  . GOITER NOS 04/21/2006    Objective:    BP 107/75 mmHg  Pulse 91  Temp(Src) 98.9 F (37.2 C)  Wt 190 lb (86.183 kg) FHT:  140 BPM  Uterine Size: size greater than dates  Presentation: cephalic  Pelvic Exam:              Dilation: 1cm       Effacement: 50%   Station:  -2     Consistency: medium            Position: posterior     Assessment:    Pregnancy @ [redacted]w[redacted]d  weeks   Plan:    Postdates management: discussed fetal surveillance and induction, discussed fetal movement, NST reactive, biophysical profile ordered. Induction: scheduled for 07-16-15, written information given.

## 2015-07-14 ENCOUNTER — Encounter (HOSPITAL_COMMUNITY): Payer: Self-pay

## 2015-07-14 ENCOUNTER — Inpatient Hospital Stay (HOSPITAL_COMMUNITY)
Admit: 2015-07-14 | Discharge: 2015-07-18 | DRG: 766 | Disposition: A | Discharge status: Home / Self Care | Payer: BLUE CROSS/BLUE SHIELD | Source: Ambulatory Visit | Attending: Obstetrics | Admitting: Obstetrics

## 2015-07-14 ENCOUNTER — Inpatient Hospital Stay (HOSPITAL_COMMUNITY): Payer: BLUE CROSS/BLUE SHIELD | Admitting: Anesthesiology

## 2015-07-14 DIAGNOSIS — Z8261 Family history of arthritis: Secondary | ICD-10-CM | POA: Diagnosis not present

## 2015-07-14 DIAGNOSIS — Z8249 Family history of ischemic heart disease and other diseases of the circulatory system: Secondary | ICD-10-CM | POA: Diagnosis not present

## 2015-07-14 DIAGNOSIS — Z87891 Personal history of nicotine dependence: Secondary | ICD-10-CM

## 2015-07-14 DIAGNOSIS — Z825 Family history of asthma and other chronic lower respiratory diseases: Secondary | ICD-10-CM | POA: Diagnosis not present

## 2015-07-14 DIAGNOSIS — O328XX0 Maternal care for other malpresentation of fetus, not applicable or unspecified: Secondary | ICD-10-CM | POA: Diagnosis present

## 2015-07-14 DIAGNOSIS — Z98891 History of uterine scar from previous surgery: Secondary | ICD-10-CM

## 2015-07-14 DIAGNOSIS — Z833 Family history of diabetes mellitus: Secondary | ICD-10-CM

## 2015-07-14 DIAGNOSIS — Z9889 Other specified postprocedural states: Secondary | ICD-10-CM

## 2015-07-14 DIAGNOSIS — IMO0002 Reserved for concepts with insufficient information to code with codable children: Secondary | ICD-10-CM | POA: Diagnosis present

## 2015-07-14 LAB — TYPE AND SCREEN
ABO/RH(D): A POS
ANTIBODY SCREEN: NEGATIVE

## 2015-07-14 LAB — CBC
HCT: 37.1 % (ref 36.0–46.0)
Hemoglobin: 12.7 g/dL (ref 12.0–15.0)
MCH: 30.8 pg (ref 26.0–34.0)
MCHC: 34.2 g/dL (ref 30.0–36.0)
MCV: 90 fL (ref 78.0–100.0)
Platelets: 259 10*3/uL (ref 150–400)
RBC: 4.12 MIL/uL (ref 3.87–5.11)
RDW: 13.7 % (ref 11.5–15.5)
WBC: 10.4 10*3/uL (ref 4.0–10.5)

## 2015-07-14 LAB — CULTURE, OB URINE

## 2015-07-14 LAB — RPR: RPR Ser Ql: NONREACTIVE

## 2015-07-14 LAB — POCT FERN TEST: POCT Fern Test: POSITIVE

## 2015-07-14 LAB — URINE CULTURE, OB REFLEX

## 2015-07-14 MED ORDER — LEVOTHYROXINE SODIUM 150 MCG PO TABS
150.0000 ug | ORAL_TABLET | Freq: Every day | ORAL | Status: DC
  Administered 2015-07-14: 150 ug via ORAL
  Filled 2015-07-14 (×3): qty 1

## 2015-07-14 MED ORDER — LIDOCAINE HCL (PF) 1 % IJ SOLN
30.0000 mL | INTRAMUSCULAR | Status: DC | PRN
  Filled 2015-07-14: qty 30

## 2015-07-14 MED ORDER — PROMETHAZINE HCL 25 MG/ML IJ SOLN
25.0000 mg | Freq: Four times a day (QID) | INTRAMUSCULAR | Status: DC | PRN
  Administered 2015-07-14: 25 mg via INTRAMUSCULAR
  Filled 2015-07-14: qty 1

## 2015-07-14 MED ORDER — OXYCODONE-ACETAMINOPHEN 5-325 MG PO TABS: 1.0000 | ORAL_TABLET | ORAL | Status: DC | PRN

## 2015-07-14 MED ORDER — SOD CITRATE-CITRIC ACID 500-334 MG/5ML PO SOLN: 30.0000 mL | ORAL | Status: DC | PRN

## 2015-07-14 MED ORDER — PHENYLEPHRINE 40 MCG/ML (10ML) SYRINGE FOR IV PUSH (FOR BLOOD PRESSURE SUPPORT): 80.0000 ug | PREFILLED_SYRINGE | INTRAVENOUS | Status: DC | PRN

## 2015-07-14 MED ORDER — ACETAMINOPHEN 325 MG PO TABS: 650.0000 mg | ORAL_TABLET | ORAL | Status: DC | PRN

## 2015-07-14 MED ORDER — ONDANSETRON HCL 4 MG/2ML IJ SOLN: 4.0000 mg | Freq: Four times a day (QID) | INTRAMUSCULAR | Status: DC | PRN

## 2015-07-14 MED ORDER — OXYTOCIN 40 UNITS IN LACTATED RINGERS INFUSION - SIMPLE MED
1.0000 m[IU]/min | INTRAVENOUS | Status: DC
  Administered 2015-07-14: 2 m[IU]/min via INTRAVENOUS
  Filled 2015-07-14: qty 1000

## 2015-07-14 MED ORDER — OXYCODONE-ACETAMINOPHEN 5-325 MG PO TABS: 2.0000 | ORAL_TABLET | ORAL | Status: DC | PRN

## 2015-07-14 MED ORDER — NALBUPHINE HCL 10 MG/ML IJ SOLN
10.0000 mg | Freq: Four times a day (QID) | INTRAMUSCULAR | Status: DC | PRN
  Administered 2015-07-14: 10 mg via INTRAMUSCULAR
  Filled 2015-07-14: qty 1

## 2015-07-14 MED ORDER — FENTANYL 2.5 MCG/ML BUPIVACAINE 1/10 % EPIDURAL INFUSION (WH - ANES)
14.0000 mL/h | INTRAMUSCULAR | Status: DC | PRN
  Administered 2015-07-14 – 2015-07-15 (×3): 14 mL/h via EPIDURAL
  Filled 2015-07-14 (×3): qty 125

## 2015-07-14 MED ORDER — LACTATED RINGERS IV SOLN: 500.0000 mL | INTRAVENOUS | Status: DC | PRN

## 2015-07-14 MED ORDER — LACTATED RINGERS IV SOLN
500.0000 mL | INTRAVENOUS | Status: DC | PRN
  Administered 2015-07-14: 500 mL via INTRAVENOUS

## 2015-07-14 MED ORDER — EPHEDRINE 5 MG/ML INJ: 10.0000 mg | INTRAVENOUS | Status: DC | PRN

## 2015-07-14 MED ORDER — PHENYLEPHRINE 40 MCG/ML (10ML) SYRINGE FOR IV PUSH (FOR BLOOD PRESSURE SUPPORT)
80.0000 ug | PREFILLED_SYRINGE | INTRAVENOUS | Status: DC | PRN
  Filled 2015-07-14: qty 10

## 2015-07-14 MED ORDER — OXYTOCIN 40 UNITS IN LACTATED RINGERS INFUSION - SIMPLE MED: 2.5000 [IU]/h | INTRAVENOUS | Status: DC

## 2015-07-14 MED ORDER — TERBUTALINE SULFATE 1 MG/ML IJ SOLN
0.2500 mg | Freq: Once | INTRAMUSCULAR | Status: DC | PRN
  Filled 2015-07-14: qty 1

## 2015-07-14 MED ORDER — LIDOCAINE HCL (PF) 1 % IJ SOLN: 30.0000 mL | INTRAMUSCULAR | Status: DC | PRN

## 2015-07-14 MED ORDER — LIDOCAINE HCL (PF) 1 % IJ SOLN
INTRAMUSCULAR | Status: DC | PRN
  Administered 2015-07-14: 4 mL
  Administered 2015-07-14: 6 mL via EPIDURAL

## 2015-07-14 MED ORDER — BUTORPHANOL TARTRATE 1 MG/ML IJ SOLN: 1.0000 mg | INTRAMUSCULAR | Status: DC | PRN

## 2015-07-14 MED ORDER — DIPHENHYDRAMINE HCL 50 MG/ML IJ SOLN: 12.5000 mg | INTRAMUSCULAR | Status: DC | PRN

## 2015-07-14 MED ORDER — LEVOTHYROXINE SODIUM 150 MCG PO TABS
150.0000 ug | ORAL_TABLET | Freq: Every day | ORAL | Status: DC
  Filled 2015-07-14 (×2): qty 1

## 2015-07-14 MED ORDER — LACTATED RINGERS IV SOLN: INTRAVENOUS | Status: DC

## 2015-07-14 MED ORDER — OXYTOCIN BOLUS FROM INFUSION: 500.0000 mL | INTRAVENOUS | Status: DC

## 2015-07-14 MED ORDER — LACTATED RINGERS IV SOLN
INTRAVENOUS | Status: DC
  Administered 2015-07-14: via INTRAVENOUS
  Administered 2015-07-14: 125 mL/h via INTRAVENOUS
  Administered 2015-07-15: 04:00:00 via INTRAVENOUS

## 2015-07-14 MED ORDER — HYDROXYZINE HCL 50 MG PO TABS: 50.0000 mg | ORAL_TABLET | Freq: Four times a day (QID) | ORAL | Status: DC | PRN

## 2015-07-14 MED ORDER — HYDROXYZINE HCL 50 MG/ML IM SOLN
50.0000 mg | Freq: Four times a day (QID) | INTRAMUSCULAR | Status: DC | PRN
  Filled 2015-07-14: qty 1

## 2015-07-14 MED ORDER — SOD CITRATE-CITRIC ACID 500-334 MG/5ML PO SOLN
30.0000 mL | ORAL | Status: DC | PRN
  Administered 2015-07-15: 30 mL via ORAL
  Filled 2015-07-14: qty 15

## 2015-07-14 MED ORDER — FLEET ENEMA 7-19 GM/118ML RE ENEM: 1.0000 | ENEMA | RECTAL | Status: DC | PRN

## 2015-07-14 MED ORDER — NALBUPHINE HCL 10 MG/ML IJ SOLN
10.0000 mg | INTRAMUSCULAR | Status: DC | PRN
  Administered 2015-07-14: 10 mg via INTRAVENOUS
  Filled 2015-07-14: qty 1

## 2015-07-14 MED ORDER — LACTATED RINGERS IV SOLN: 500.0000 mL | Freq: Once | INTRAVENOUS | Status: DC

## 2015-07-14 MED ORDER — OXYTOCIN 40 UNITS IN LACTATED RINGERS INFUSION - SIMPLE MED
2.5000 [IU]/h | INTRAVENOUS | Status: DC
Start: 2015-07-14 — End: 2015-07-15

## 2015-07-14 NOTE — MAU Note (Signed)
Ctxs since 0400. Leaking pinkish fld since 0330. TOLAC. First del c/s for breech

## 2015-07-14 NOTE — Progress Notes (Signed)
Monitors removed for epidural placement.

## 2015-07-14 NOTE — Anesthesia Preprocedure Evaluation (Signed)

## 2015-07-14 NOTE — Anesthesia Pain Management Evaluation Note (Signed)
  [facility] Pain Management Visit Note  Patient: Kiara Juarez, 30 y.o., female  "Hello I am a member of the anesthesia team at [facility]. We have an anesthesia team available at all times to provide care throughout the hospital, including epidural management and anesthesia for C-section. I don't know your plan for the delivery whether it a natural birth, water birth, IV sedation, nitrous supplementation, doula or epidural, but we want to meet your pain goals."   1.Was your pain managed to your expectations on prior hospitalizations?   yes  2.What is your expectation for pain management during this hospitalization?   epidural   3.How can we help you reach that goal? Epidural  Record the patient's initial score and the patient's pain goal.   Pain:5-6  Pain Goal: 8  [facility] wants you to be able to say your pain was always managed very well.  [facility] 07/14/2015

## 2015-07-14 NOTE — Anesthesia Procedure Notes (Signed)

## 2015-07-14 NOTE — H&P (Signed)
Kiara Juarez is a 30 y.o. female presenting for UC's and SROM. Maternal Medical History:  Reason for admission: Rupture of membranes and contractions.   Fetal activity: Perceived fetal activity is normal.   Last perceived fetal movement was within the past hour.    Prenatal complications: no prenatal complications Prenatal Complications - Diabetes: none.    OB History    Gravida Para Term Preterm AB TAB SAB Ectopic Multiple Living   3 1 1  0 1 0 0 1  1      Obstetric Comments   Patient had ectopic pregnancy and miscarriage was carrying twins.      Past Medical History  Diagnosis Date  . Hypothyroid   . Headache(784.0)     otc meds prn  . Blood transfusion without reported diagnosis   . Thyroid disease    Past Surgical History  Procedure Laterality Date  . Dilation and evacuation N/A 03/17/2012    Procedure: DILATATION AND EVACUATION;  Surgeon: Lahoma Crocker, MD;  Location: [facility];  Service: Gynecology;  Laterality: N/A;  . Laparotomy N/A 04/02/2012    Procedure: EXPLORATORY LAPAROTOMY;  Surgeon: Lahoma Crocker, MD;  Location: [facility];  Service: Gynecology;  Laterality: N/A;  . Unilateral salpingectomy Left 04/02/2012    Procedure: UNILATERAL SALPINGECTOMY;  Surgeon: Lahoma Crocker, MD;  Location: [facility];  Service: Gynecology;  Laterality: Left;  partial salpingectomy  . Cesarean section N/A 09/28/2013    Procedure: CESAREAN SECTION;  Surgeon: Lahoma Crocker, MD;  Location: [facility];  Service: Obstetrics;  Laterality: N/A;  . Wisdom tooth extraction    . Dilation and curettage of uterus    . Cesarean section     Family History: family history includes Arthritis in her maternal grandmother; Asthma in her maternal grandmother; Atrial fibrillation in her mother; Diabetes in her maternal grandfather and maternal grandmother; Heart disease in her mother; Kidney disease in her maternal grandfather. Social History:  reports that she has quit smoking. She does not have  any smokeless tobacco history on file. She reports that she does not drink alcohol or use illicit drugs.   Prenatal Transfer Tool  Maternal Diabetes: No Genetic Screening: Normal Maternal Ultrasounds/Referrals: Normal Fetal Ultrasounds or other Referrals:  None Maternal Substance Abuse:  No Significant Maternal Medications:  None Significant Maternal Lab Results:  Lab values include: Group B Strep negative Other Comments:  None  Review of Systems  All other systems reviewed and are negative.   Dilation: 1 Effacement (%): 70 Station: -2 Exam by:: Blima Singer RNC Blood pressure 94/70, pulse 92, temperature 97.7 F (36.5 C), resp. rate 20, height 5\' 3"  (1.6 m), weight 191 lb 9.6 oz (86.909 kg), currently breastfeeding. Maternal Exam:  Abdomen: Patient reports no abdominal tenderness. Fetal presentation: vertex  Introitus: Normal vulva. Normal vagina.  Cervix: Cervix evaluated by digital exam.     Physical Exam  Nursing note and vitals reviewed. Constitutional: She is oriented to person, place, and time. She appears well-developed and well-nourished.  HENT:  Head: Normocephalic and atraumatic.  Eyes: Conjunctivae are normal. Pupils are equal, round, and reactive to light.  Neck: Normal range of motion. Neck supple.  Cardiovascular: Normal rate and regular rhythm.   Respiratory: Effort normal and breath sounds normal.  GI: Soft. Bowel sounds are normal.  Genitourinary: Vagina normal and uterus normal.  Musculoskeletal: Normal range of motion.  Neurological: She is alert and oriented to person, place, and time.  Skin: Skin is warm and dry.  Psychiatric: She has a  normal mood and affect. Her behavior is normal. Judgment and thought content normal.    Prenatal labs: ABO, Rh: --/--/A POS (06/24 0753) Antibody: NEG (06/24 0753) Rubella: 4.68 (12/07 1501) RPR: Non Reactive (04/05 1105)  HBsAg: NEGATIVE (12/07 1501)  HIV: Non Reactive (04/05 1105)  GBS: Negative (06/01 0000)    Assessment/Plan: 40 weeks.  SROM.  Admit.  Pitocin prn.   Seren Chaloux A 07/14/2015, 11:57 AM

## 2015-07-15 ENCOUNTER — Encounter (HOSPITAL_COMMUNITY): Payer: Self-pay

## 2015-07-15 ENCOUNTER — Encounter (HOSPITAL_COMMUNITY): Disposition: A | Discharge status: Home / Self Care | Payer: Self-pay | Source: Ambulatory Visit | Attending: Obstetrics

## 2015-07-15 SURGERY — Surgical Case
Anesthesia: Epidural

## 2015-07-15 MED ORDER — KETOROLAC TROMETHAMINE 30 MG/ML IJ SOLN
30.0000 mg | Freq: Four times a day (QID) | INTRAMUSCULAR | Status: DC | PRN
  Administered 2015-07-15: 30 mg via INTRAMUSCULAR

## 2015-07-15 MED ORDER — NALBUPHINE HCL 10 MG/ML IJ SOLN
5.0000 mg | INTRAMUSCULAR | Status: DC | PRN
  Administered 2015-07-15: 5 mg via SUBCUTANEOUS
  Filled 2015-07-15: qty 1

## 2015-07-15 MED ORDER — ZOLPIDEM TARTRATE 5 MG PO TABS: 5.0000 mg | ORAL_TABLET | Freq: Every evening | ORAL | Status: DC | PRN

## 2015-07-15 MED ORDER — DIPHENHYDRAMINE HCL 50 MG/ML IJ SOLN: 12.5000 mg | INTRAMUSCULAR | Status: DC | PRN

## 2015-07-15 MED ORDER — LACTATED RINGERS IV SOLN
INTRAVENOUS | Status: DC
  Administered 2015-07-15 – 2015-07-16 (×2): via INTRAVENOUS

## 2015-07-15 MED ORDER — METHYLERGONOVINE MALEATE 0.2 MG/ML IJ SOLN
INTRAMUSCULAR | Status: DC | PRN
  Administered 2015-07-15: 0.2 mg via INTRAMUSCULAR

## 2015-07-15 MED ORDER — SODIUM BICARBONATE 8.4 % IV SOLN
INTRAVENOUS | Status: DC | PRN
  Administered 2015-07-15: 2 mL via EPIDURAL
  Administered 2015-07-15: 5 mL via EPIDURAL
  Administered 2015-07-15: 2 mL via EPIDURAL
  Administered 2015-07-15: 5 mL via EPIDURAL
  Administered 2015-07-15: 2 mL via EPIDURAL

## 2015-07-15 MED ORDER — KETAMINE HCL 10 MG/ML IJ SOLN
INTRAMUSCULAR | Status: AC
  Filled 2015-07-15: qty 1

## 2015-07-15 MED ORDER — LACTATED RINGERS IV SOLN
INTRAVENOUS | Status: DC | PRN
  Administered 2015-07-15: 14:00:00 via INTRAVENOUS

## 2015-07-15 MED ORDER — SCOPOLAMINE 1 MG/3DAYS TD PT72
1.0000 | MEDICATED_PATCH | Freq: Once | TRANSDERMAL | Status: DC
Start: 2015-07-15 — End: 2015-07-18
  Filled 2015-07-15: qty 1

## 2015-07-15 MED ORDER — NALOXONE HCL 0.4 MG/ML IJ SOLN: 0.4000 mg | INTRAMUSCULAR | Status: DC | PRN

## 2015-07-15 MED ORDER — SODIUM CHLORIDE 0.9% FLUSH: 3.0000 mL | INTRAVENOUS | Status: DC | PRN

## 2015-07-15 MED ORDER — OXYCODONE-ACETAMINOPHEN 5-325 MG PO TABS
1.0000 | ORAL_TABLET | ORAL | Status: DC | PRN
  Administered 2015-07-16 – 2015-07-17 (×5): 1 via ORAL
  Filled 2015-07-15 (×4): qty 1

## 2015-07-15 MED ORDER — MEPERIDINE HCL 25 MG/ML IJ SOLN: 6.2500 mg | INTRAMUSCULAR | Status: DC | PRN

## 2015-07-15 MED ORDER — PRENATAL MULTIVITAMIN CH
1.0000 | ORAL_TABLET | Freq: Every day | ORAL | Status: DC
  Administered 2015-07-17 – 2015-07-18 (×2): 1 via ORAL
  Filled 2015-07-15 (×3): qty 1

## 2015-07-15 MED ORDER — FENTANYL CITRATE (PF) 100 MCG/2ML IJ SOLN
INTRAMUSCULAR | Status: AC
  Filled 2015-07-15: qty 2

## 2015-07-15 MED ORDER — KETOROLAC TROMETHAMINE 30 MG/ML IJ SOLN
30.0000 mg | Freq: Four times a day (QID) | INTRAMUSCULAR | Status: DC | PRN
Start: 2015-07-15 — End: 2015-07-15

## 2015-07-15 MED ORDER — OXYTOCIN 10 UNIT/ML IJ SOLN
INTRAMUSCULAR | Status: AC
  Filled 2015-07-15: qty 4

## 2015-07-15 MED ORDER — DIPHENHYDRAMINE HCL 25 MG PO CAPS: 25.0000 mg | ORAL_CAPSULE | ORAL | Status: DC | PRN

## 2015-07-15 MED ORDER — KETOROLAC TROMETHAMINE 30 MG/ML IJ SOLN
INTRAMUSCULAR | Status: AC
  Filled 2015-07-15: qty 1

## 2015-07-15 MED ORDER — FENTANYL CITRATE (PF) 100 MCG/2ML IJ SOLN
INTRAMUSCULAR | Status: DC | PRN
  Administered 2015-07-15 (×2): 50 ug via INTRAVENOUS

## 2015-07-15 MED ORDER — SODIUM BICARBONATE 8.4 % IV SOLN
INTRAVENOUS | Status: AC
  Filled 2015-07-15: qty 50

## 2015-07-15 MED ORDER — KETAMINE HCL 10 MG/ML IJ SOLN
INTRAMUSCULAR | Status: DC | PRN
  Administered 2015-07-15: 20 mg via INTRAVENOUS

## 2015-07-15 MED ORDER — DIPHENHYDRAMINE HCL 25 MG PO CAPS: 25.0000 mg | ORAL_CAPSULE | Freq: Four times a day (QID) | ORAL | Status: DC | PRN

## 2015-07-15 MED ORDER — CEFAZOLIN SODIUM-DEXTROSE 2-4 GM/100ML-% IV SOLN
2.0000 g | Freq: Once | INTRAVENOUS | Status: AC
  Administered 2015-07-15: 2 g via INTRAVENOUS
  Filled 2015-07-15: qty 100

## 2015-07-15 MED ORDER — WITCH HAZEL-GLYCERIN EX PADS: 1.0000 "application " | MEDICATED_PAD | CUTANEOUS | Status: DC | PRN

## 2015-07-15 MED ORDER — LACTATED RINGERS IV SOLN
INTRAVENOUS | Status: DC | PRN
Start: 2015-07-15 — End: 2015-07-15
  Administered 2015-07-15: 14:00:00 via INTRAVENOUS

## 2015-07-15 MED ORDER — SIMETHICONE 80 MG PO CHEW
80.0000 mg | CHEWABLE_TABLET | ORAL | Status: DC
  Administered 2015-07-16 – 2015-07-17 (×2): 80 mg via ORAL
  Filled 2015-07-15 (×2): qty 1

## 2015-07-15 MED ORDER — ACETAMINOPHEN 325 MG PO TABS: 650.0000 mg | ORAL_TABLET | ORAL | Status: DC | PRN

## 2015-07-15 MED ORDER — MORPHINE SULFATE (PF) 0.5 MG/ML IJ SOLN
INTRAMUSCULAR | Status: DC | PRN
  Administered 2015-07-15: 1 mg via INTRAVENOUS
  Administered 2015-07-15: 4 mg via EPIDURAL

## 2015-07-15 MED ORDER — SIMETHICONE 80 MG PO CHEW
80.0000 mg | CHEWABLE_TABLET | ORAL | Status: DC | PRN
  Filled 2015-07-15: qty 1

## 2015-07-15 MED ORDER — IBUPROFEN 600 MG PO TABS
600.0000 mg | ORAL_TABLET | Freq: Four times a day (QID) | ORAL | Status: DC
  Administered 2015-07-16 – 2015-07-18 (×11): 600 mg via ORAL
  Filled 2015-07-15 (×11): qty 1

## 2015-07-15 MED ORDER — TETANUS-DIPHTH-ACELL PERTUSSIS 5-2.5-18.5 LF-MCG/0.5 IM SUSP: 0.5000 mL | Freq: Once | INTRAMUSCULAR | Status: DC

## 2015-07-15 MED ORDER — DIBUCAINE 1 % RE OINT: 1.0000 "application " | TOPICAL_OINTMENT | RECTAL | Status: DC | PRN

## 2015-07-15 MED ORDER — DEXTROSE 5 % IV SOLN
1.0000 ug/kg/h | INTRAVENOUS | Status: DC | PRN
  Filled 2015-07-15: qty 2

## 2015-07-15 MED ORDER — LIDOCAINE-EPINEPHRINE (PF) 2 %-1:200000 IJ SOLN
INTRAMUSCULAR | Status: AC
  Filled 2015-07-15: qty 20

## 2015-07-15 MED ORDER — COCONUT OIL OIL
1.0000 "application " | TOPICAL_OIL | Status: DC | PRN
  Filled 2015-07-15: qty 120

## 2015-07-15 MED ORDER — LACTATED RINGERS IV SOLN: INTRAVENOUS | Status: DC

## 2015-07-15 MED ORDER — FENTANYL CITRATE (PF) 100 MCG/2ML IJ SOLN: 25.0000 ug | INTRAMUSCULAR | Status: DC | PRN

## 2015-07-15 MED ORDER — NALBUPHINE HCL 10 MG/ML IJ SOLN
5.0000 mg | INTRAMUSCULAR | Status: DC | PRN
  Administered 2015-07-16: 5 mg via INTRAVENOUS
  Filled 2015-07-15: qty 1

## 2015-07-15 MED ORDER — SENNOSIDES-DOCUSATE SODIUM 8.6-50 MG PO TABS
2.0000 | ORAL_TABLET | ORAL | Status: DC
  Administered 2015-07-16 – 2015-07-17 (×3): 2 via ORAL
  Filled 2015-07-15 (×2): qty 2

## 2015-07-15 MED ORDER — MENTHOL 3 MG MT LOZG: 1.0000 | LOZENGE | OROMUCOSAL | Status: DC | PRN

## 2015-07-15 MED ORDER — MIDAZOLAM HCL 2 MG/2ML IJ SOLN
INTRAMUSCULAR | Status: AC
  Filled 2015-07-15: qty 2

## 2015-07-15 MED ORDER — DEXTROSE 5 % IV SOLN
2.0000 g | Freq: Four times a day (QID) | INTRAVENOUS | Status: DC
  Administered 2015-07-15: 2 g via INTRAVENOUS
  Filled 2015-07-15 (×4): qty 2

## 2015-07-15 MED ORDER — MIDAZOLAM HCL 2 MG/2ML IJ SOLN
INTRAMUSCULAR | Status: DC | PRN
  Administered 2015-07-15: 1 mg via INTRAVENOUS

## 2015-07-15 MED ORDER — SIMETHICONE 80 MG PO CHEW
80.0000 mg | CHEWABLE_TABLET | Freq: Three times a day (TID) | ORAL | Status: DC
  Administered 2015-07-15 – 2015-07-18 (×8): 80 mg via ORAL
  Filled 2015-07-15 (×8): qty 1

## 2015-07-15 MED ORDER — NALBUPHINE HCL 10 MG/ML IJ SOLN: 5.0000 mg | Freq: Once | INTRAMUSCULAR | Status: DC | PRN

## 2015-07-15 MED ORDER — ONDANSETRON HCL 4 MG/2ML IJ SOLN: 4.0000 mg | Freq: Three times a day (TID) | INTRAMUSCULAR | Status: DC | PRN

## 2015-07-15 MED ORDER — MORPHINE SULFATE (PF) 0.5 MG/ML IJ SOLN
INTRAMUSCULAR | Status: AC
  Filled 2015-07-15: qty 10

## 2015-07-15 MED ORDER — OXYCODONE-ACETAMINOPHEN 5-325 MG PO TABS
2.0000 | ORAL_TABLET | ORAL | Status: DC | PRN
  Filled 2015-07-15: qty 2

## 2015-07-15 MED ORDER — OXYTOCIN 40 UNITS IN LACTATED RINGERS INFUSION - SIMPLE MED: 2.5000 [IU]/h | INTRAVENOUS | Status: AC

## 2015-07-15 MED ORDER — OXYTOCIN 10 UNIT/ML IJ SOLN
40.0000 [IU] | INTRAMUSCULAR | Status: DC | PRN
  Administered 2015-07-15: 40 [IU] via INTRAVENOUS

## 2015-07-15 MED ORDER — ONDANSETRON HCL 4 MG/2ML IJ SOLN
INTRAMUSCULAR | Status: DC | PRN
  Administered 2015-07-15: 4 mg via INTRAVENOUS

## 2015-07-15 MED ORDER — ONDANSETRON HCL 4 MG/2ML IJ SOLN
INTRAMUSCULAR | Status: AC
  Filled 2015-07-15: qty 2

## 2015-07-15 SURGICAL SUPPLY — 35 items
CLAMP CORD UMBIL (MISCELLANEOUS) IMPLANT
CLOTH BEACON ORANGE TIMEOUT ST (SAFETY) ×2 IMPLANT
CONTAINER PREFILL 10% NBF 15ML (MISCELLANEOUS) ×4 IMPLANT
DRSG OPSITE POSTOP 4X10 (GAUZE/BANDAGES/DRESSINGS) ×2 IMPLANT
DURAPREP 26ML APPLICATOR (WOUND CARE) ×2 IMPLANT
ELECT REM PT RETURN 9FT ADLT (ELECTROSURGICAL) ×2
ELECTRODE REM PT RTRN 9FT ADLT (ELECTROSURGICAL) ×1 IMPLANT
EXTRACTOR VACUUM M CUP 4 TUBE (SUCTIONS) IMPLANT
GLOVE BIO SURGEON STRL SZ8 (GLOVE) ×2 IMPLANT
GLOVE BIOGEL PI IND STRL 7.0 (GLOVE) ×1 IMPLANT
GLOVE BIOGEL PI INDICATOR 7.0 (GLOVE) ×1
GOWN STRL REUS W/TWL LRG LVL3 (GOWN DISPOSABLE) ×4 IMPLANT
KIT ABG SYR 3ML LUER SLIP (SYRINGE) IMPLANT
LIQUID BAND (GAUZE/BANDAGES/DRESSINGS) ×2 IMPLANT
NDL HYPO 25X5/8 SAFETYGLIDE (NEEDLE) ×1 IMPLANT
NEEDLE HYPO 22GX1.5 SAFETY (NEEDLE) ×2 IMPLANT
NEEDLE HYPO 25X5/8 SAFETYGLIDE (NEEDLE) ×2 IMPLANT
NS IRRIG 1000ML POUR BTL (IV SOLUTION) ×2 IMPLANT
PACK C SECTION WH (CUSTOM PROCEDURE TRAY) ×2 IMPLANT
PAD OB MATERNITY 4.3X12.25 (PERSONAL CARE ITEMS) ×2 IMPLANT
PENCIL SMOKE EVAC W/HOLSTER (ELECTROSURGICAL) ×2 IMPLANT
RTRCTR C-SECT PINK 25CM LRG (MISCELLANEOUS) ×2 IMPLANT
SUT GUT PLAIN 0 CT-3 TAN 27 (SUTURE) IMPLANT
SUT MNCRL 0 VIOLET CTX 36 (SUTURE) ×3 IMPLANT
SUT MNCRL AB 4-0 PS2 18 (SUTURE) IMPLANT
SUT MON AB 2-0 CT1 27 (SUTURE) ×2 IMPLANT
SUT MON AB 3-0 SH 27 (SUTURE)
SUT MON AB 3-0 SH27 (SUTURE) IMPLANT
SUT MONOCRYL 0 CTX 36 (SUTURE) ×3
SUT PLAIN 2 0 XLH (SUTURE) IMPLANT
SUT VIC AB 0 CTX 36 (SUTURE) ×4
SUT VIC AB 0 CTX36XBRD ANBCTRL (SUTURE) ×2 IMPLANT
SYR CONTROL 10ML LL (SYRINGE) ×2 IMPLANT
TOWEL OR 17X24 6PK STRL BLUE (TOWEL DISPOSABLE) ×2 IMPLANT
TRAY FOLEY CATH SILVER 14FR (SET/KITS/TRAYS/PACK) ×2 IMPLANT

## 2015-07-15 NOTE — Progress Notes (Signed)
Updated provider--orders to continue to increase pitocin beyond 6 milliunits

## 2015-07-15 NOTE — Transfer of Care (Signed)
Immediate Anesthesia Transfer of Care Note  Patient: Kiara Juarez  Procedure(s) Performed: Procedure(s): CESAREAN SECTION (N/A)  Patient Location: PACU  Anesthesia Type:Epidural  Level of Consciousness: awake and alert   Airway & Oxygen Therapy: Patient Spontanous Breathing and Patient connected to nasal cannula oxygen  Post-op Assessment: Report given to RN and Post -op Vital signs reviewed and stable  Post vital signs: Reviewed and stable  Last Vitals:  Filed Vitals:   07/15/15 1230 07/15/15 1300  BP: 113/66 107/61  Pulse: 79 75  Temp:    Resp: 18 18    Last Pain:  Filed Vitals:   07/15/15 1418  PainSc: 6       Patients Stated Pain Goal: 0 ([id number])  Complications: No apparent anesthesia complications

## 2015-07-15 NOTE — Op Note (Signed)
Cesarean Section Procedure Note   Kiara Juarez   07/14/2015 - 07/15/2015  Indications: Arrest of Descent   Pre-operative Diagnosis: cesarean section; arrest of descent.   Post-operative Diagnosis: Same.  Persistent Occiput Posterior Position  Surgeon: Aava Deland A  Assistants: MARSHALL BERNARD A.  Anesthesia: epidural  Procedure Details:  The patient was seen in the Holding Room. The risks, benefits, complications, treatment options, and expected outcomes were discussed with the patient. The patient concurred with the proposed plan, giving informed consent. The patient was identified as Kiara Juarez and the procedure verified as C-Section Delivery. A Time Out was held and the above information confirmed.  After induction of anesthesia, the patient was draped and prepped in the usual sterile manner. A transverse incision was made and carried down through the subcutaneous tissue to the fascia. The fascial incision was made and extended transversely. The fascia was separated from the underlying rectus tissue superiorly and inferiorly. The peritoneum was identified and entered. The peritoneal incision was extended longitudinally. The utero-vesical peritoneal reflection was incised transversely and the bladder flap was bluntly freed from the lower uterine segment. A low transverse uterine incision was made. Delivered from cephalic presentation was a 3125 gram living newborn female infant(s). APGAR (1 MIN): 8   APGAR (5 MINS): 9   APGAR (10 MINS):    A cord ph was not sent. The umbilical cord was clamped and cut cord. A sample was obtained for evaluation. The placenta was removed Intact and appeared normal.  The uterine incision was closed with running locked sutures of 0 Monocryl. A second imbricating layer of the same suture was placed.  Hemostasis was observed. The paracolic gutters were irrigated. The parieto peritoneum was closed in a running fashion with 2-0 Vicryl.  The fascia was then  reapproximated with running sutures of 0 Vicryl.  The skin was closed with staples.  Instrument, sponge, and needle counts were correct prior the abdominal closure and were correct at the conclusion of the case.    Findings: Normal uterus, ovaries and tubes   Estimated Blood Loss:  644ml  Total IV Fluids: 231ml   Urine Output: 500CC OF clear urine  Specimens: Placenta to pathology  Complications: no complications  Disposition: PACU - hemodynamically stable.  Maternal Condition: stable   Baby condition / location:  Couplet care / Skin to Skin    Signed: Surgeon(s): Shelly Bombard, MD Frederico Hamman, MD

## 2015-07-15 NOTE — Progress Notes (Signed)
Kiara Juarez is a 30 y.o. G3P1011 at [redacted]w[redacted]d by LMP admitted for rupture of membranes  Subjective:   Objective: BP 110/62 mmHg  Pulse 74  Temp(Src) 98.9 F (37.2 C) (Oral)  Resp 18  Ht 5\' 3"  (1.6 m)  Wt 191 lb 9.6 oz (86.909 kg)  BMI 33.95 kg/m2  SpO2 100%      FHT:  FHR: 125 bpm, variability: moderate,  accelerations:  Present,  decelerations:  Present variable UC:   regular, every 2-3 minutes SVE:   Dilation: 10 Effacement (%): 100 Station: +1 Exam by:: Alvin Critchley, RN   Labs: Lab Results  Component Value Date   WBC 10.4 07/14/2015   HGB 12.7 07/14/2015   HCT 37.1 07/14/2015   MCV 90.0 07/14/2015   PLT 259 07/14/2015    Assessment / Plan: No progress in descent for > 8 hours.  Will proceed with C/S delivery for arrest of descent.  Labor: Arrest of descent Preeclampsia:  n/a Fetal Wellbeing:  Category I Pain Control:  Epidural I/D:  n/a Anticipated MOD:  NSVD  Geordan Xu A 07/15/2015, 12:24 PM

## 2015-07-15 NOTE — Consult Note (Signed)
Neonatology Note:   Attendance at C-section:    I was asked by Dr. Jodi Mourning to attend this repeat C/S at term after FTP. The mother is a G3P1, GBS neg with good prenatal care and hypothyroidism. ROM >18 hours before delivery with aIAP, fluid clear. Infant vigorous with good spontaneous cry and tone. Needed only minimal bulb suctioning. Ap 8/9. Lungs clear to ausc in DR. To CN to care of Pediatrician.  Monia Sabal Katherina Mires, MD

## 2015-07-15 NOTE — Progress Notes (Signed)
Kiara Juarez is a 30 y.o. G3P1011 at [redacted]w[redacted]d by LMP admitted for rupture of membranes  Subjective:   Objective: BP 94/66 mmHg  Pulse 83  Temp(Src) 98.9 F (37.2 C) (Oral)  Resp 18  Ht 5\' 3"  (1.6 m)  Wt 191 lb 9.6 oz (86.909 kg)  BMI 33.95 kg/m2      FHT:  FHR: 130 bpm, variability: moderate,  accelerations:  Present,  decelerations:  Absent UC:   regular, every 2-3 minutes SVE:   Dilation: 10 Effacement (%): 100 Station: 0 Exam by:: Alvin Critchley;, RN   Labs: Lab Results  Component Value Date   WBC 10.4 07/14/2015   HGB 12.7 07/14/2015   HCT 37.1 07/14/2015   MCV 90.0 07/14/2015   PLT 259 07/14/2015    Assessment / Plan: Augmentation of labor, progressing well  Labor: Progressing normally Preeclampsia:  n/a Fetal Wellbeing:  good Pain Control:  Epidural I/D:  n/a Anticipated MOD:  NSVD  Kiara Juarez A 07/15/2015, 3:27 AM

## 2015-07-16 ENCOUNTER — Inpatient Hospital Stay (HOSPITAL_COMMUNITY): Payer: No Typology Code available for payment source

## 2015-07-16 ENCOUNTER — Encounter: Payer: BLUE CROSS/BLUE SHIELD | Admitting: Obstetrics

## 2015-07-16 LAB — CBC
HCT: 30.7 % — ABNORMAL LOW (ref 36.0–46.0)
HEMOGLOBIN: 10.2 g/dL — AB (ref 12.0–15.0)
MCH: 30.4 pg (ref 26.0–34.0)
MCHC: 33.2 g/dL (ref 30.0–36.0)
MCV: 91.6 fL (ref 78.0–100.0)
PLATELETS: 213 10*3/uL (ref 150–400)
RBC: 3.35 MIL/uL — AB (ref 3.87–5.11)
RDW: 14 % (ref 11.5–15.5)
WBC: 11.8 10*3/uL — AB (ref 4.0–10.5)

## 2015-07-16 MED ORDER — LEVOTHYROXINE SODIUM 150 MCG PO TABS
150.0000 ug | ORAL_TABLET | Freq: Every day | ORAL | Status: DC
  Administered 2015-07-16 – 2015-07-18 (×3): 150 ug via ORAL
  Filled 2015-07-16 (×5): qty 1

## 2015-07-16 NOTE — Lactation Note (Signed)
This note was copied from a baby's chart. Lactation Consultation Note Follow up visit requested by RN who states baby will not stay on the breast.  LC visited at 29 hours, mom is needing to use bathroom, baby is fussy.  LC patted and burped baby who quickly fell asleep.  Mom is worried that baby is not latching well, but reports good feedings prior to this afternoon. Baby has 11 recorded feedings with 3 voids and 4 stools in past 24 hours.  LC advised mom  Baby may have been gassy or over stimulated. LC encouraged mom to eat and rest until baby is ready to feed and call for assist then as needed.  Nipples are bruised and mom reports with most recent feedings.  Mom has easily expressed milk and assured mom she can work on spoon feeding EBM if baby is not latching well. Report given to [facility] RN, Sharyn Lull  Patient Name: Kiara Juarez Today's Date: 07/16/2015     Maternal Data    Feeding Feeding Type: Breast Fed  LATCH Score/Interventions Latch: Repeated attempts needed to sustain latch, nipple held in mouth throughout feeding, stimulation needed to elicit sucking reflex. Intervention(s): Adjust position;Assist with latch  Audible Swallowing: A few with stimulation Intervention(s): Skin to skin Intervention(s): Hand expression  Type of Nipple: Flat  Comfort (Breast/Nipple): Soft / non-tender     Hold (Positioning): Assistance needed to correctly position infant at breast and maintain latch. Intervention(s): Position options;Support Pillows  LATCH Score: 6  Lactation Tools Discussed/Used     Consult Status      Elvy Mclarty, Justine Null 07/16/2015, 7:57 PM

## 2015-07-16 NOTE — Progress Notes (Signed)
Subjective: Postpartum Day 1: Cesarean Delivery Patient reports tolerating PO, + flatus and no problems voiding.    Objective: Vital signs in last 24 hours: Temp:  [98.1 F (36.7 C)-98.9 F (37.2 C)] 98.2 F (36.8 C) (06/26 0515) Pulse Rate:  [34-95] 74 (06/26 0515) Resp:  [13-20] 16 (06/26 0515) BP: (86-133)/(48-87) 86/48 mmHg (06/26 0515) SpO2:  [95 %-100 %] 98 % (06/26 0515)  Physical Exam:  General: alert and no distress Lochia: appropriate Uterine Fundus: firm Incision: healing well DVT Evaluation: No evidence of DVT seen on physical exam.   Recent Labs  07/14/15 0753 07/16/15 0642  HGB 12.7 10.2*  HCT 37.1 30.7*    Assessment/Plan: Status post Cesarean section. Doing well postoperatively.  Continue current care.  HARPER,CHARLES A 07/16/2015, 8:55 AM

## 2015-07-16 NOTE — Lactation Note (Signed)
This note was copied from a baby's chart. Lactation Consultation Note Mom didn't BF her 1st child d/t could get the baby to latch. Mom states this baby is doing so much better and she wants to BF. Mom is latching off and on. Mom stated baby BF better yesterday. Mom doesn't think the baby is getting anything. Taught hand expression, noted colostrum. Mom happy. Mom has pendulum breast, small everted nipples that are very compressible. Assisted baby in football position, taught mom postioning and latching. Mom gets frusrated d/t baby off and on.  Educated about newborn behavior, I&O, STS, cluster feeding, supply and demand. Mom states she is breast feeding now so far, but will transition to formula later when she goes home and do breast and formula. Discussed supply and demand. Referred to Baby and Me Book in Breastfeeding section Pg. 22-23 for position options and Proper latch demonstration.Mom encouraged to feed baby 8-12 times/24 hours and with feeding cues. [place] brochure given w/resources, support groups and [place] services. Patient Name: Kiara Juarez [id number] Date: 07/16/2015 Reason for consult: Initial assessment   Maternal Data Has patient been taught Hand Expression?: Yes Does the patient have breastfeeding experience prior to this delivery?: No  Feeding Feeding Type: Breast Fed Length of feed: 15 min  LATCH Score/Interventions Latch: Repeated attempts needed to sustain latch, nipple held in mouth throughout feeding, stimulation needed to elicit sucking reflex. Intervention(s): Assist with latch;Breast massage;Breast compression  Audible Swallowing: A few with stimulation Intervention(s): Skin to skin;Hand expression;Alternate breast massage  Type of Nipple: Everted at rest and after stimulation  Comfort (Breast/Nipple): Soft / non-tender     Hold (Positioning): Assistance needed to correctly position infant at breast and maintain latch. Intervention(s): Breastfeeding basics  reviewed;Support Pillows;Position options;Skin to skin  LATCH Score: 7  Lactation Tools Discussed/Used WIC Program: Yes   Consult Status Consult Status: Follow-up Date: 07/17/15 Follow-up type: In-patient    Theodoro Kalata 07/16/2015, 6:24 AM

## 2015-07-16 NOTE — Progress Notes (Signed)
MOB was referred for history of depression/anxiety. * Referral screened out by Clinical Social Worker because none of the following criteria appear to apply: ~ History of anxiety/depression during this pregnancy, or of post-partum depression. ~ Diagnosis of anxiety and/or depression within last 3 years OR * MOB's symptoms currently being treated with medication and/or therapy. Please contact the Clinical Social Worker if needs arise, or if MOB requests.   CSW acknowledged consult for MOB.  Per CSW note from 09/30/13, MOB stated that it was situational early in 2013, and denied any symptoms since the end of 2013.

## 2015-07-16 NOTE — Anesthesia Postprocedure Evaluation (Signed)
Anesthesia Post Note  Patient: Kiara Juarez  Procedure(s) Performed: Procedure(s) (LRB): CESAREAN SECTION (N/A)  Patient location during evaluation: Mother Baby Anesthesia Type: Epidural Level of consciousness: awake, awake and alert, oriented and patient cooperative Pain management: pain level controlled Vital Signs Assessment: post-procedure vital signs reviewed and stable Respiratory status: spontaneous breathing, nonlabored ventilation and respiratory function stable Cardiovascular status: stable Postop Assessment: no headache, no backache, patient able to bend at knees and no signs of nausea or vomiting Anesthetic complications: no     Last Vitals:  Filed Vitals:   07/16/15 0515 07/16/15 0915  BP: 86/48 95/59  Pulse: 74 66  Temp: 36.8 C 36.6 C  Resp: 16 18    Last Pain:  Filed Vitals:   07/16/15 0922  PainSc: 0-No pain   Pain Goal: Patients Stated Pain Goal: 3 (07/16/15 0535)               Launi Asencio L

## 2015-07-16 NOTE — Lactation Note (Signed)
This note was copied from a baby's chart. Lactation Consultation Note  Patient Name: Kiara Juarez [id number] Date: 07/16/2015 Reason for consult: Follow-up assessment  Baby at 23 hr of life. Mom is having trouble at some feedings getting baby to latch. She was told that she has flat nipples and has a Hx of using a NS. She has shells and a Harmony in the room. Baby was in football position and when mom moved her hand closer to the areola to support latch baby was able to maintain short bursts of sucking. Mom is able to easily manually express drops of colostrum. She has a DEBP at home. She denies breast or nipple pain. Discussed baby behavior, feeding frequency, baby belly size, voids, wt loss, breast changes, and nipple care. She is aware of lactation services and support group. She will call as needed.   Maternal Data    Feeding Feeding Type: Breast Fed  LATCH Score/Interventions Latch: Repeated attempts needed to sustain latch, nipple held in mouth throughout feeding, stimulation needed to elicit sucking reflex. Intervention(s): Breast compression  Audible Swallowing: A few with stimulation Intervention(s): Skin to skin;Hand expression Intervention(s): Alternate breast massage  Type of Nipple: Everted at rest and after stimulation Intervention(s): No intervention needed  Comfort (Breast/Nipple): Soft / non-tender     Hold (Positioning): Assistance needed to correctly position infant at breast and maintain latch. Intervention(s): Support Pillows;Position options  LATCH Score: 7  Lactation Tools Discussed/Used     Consult Status Consult Status: Follow-up Date: 07/17/15 Follow-up type: In-patient    Denzil Hughes 07/16/2015, 1:47 PM

## 2015-07-17 NOTE — Lactation Note (Signed)
This note was copied from a baby's chart. Lactation Consultation Note  Patient Name: Kiara Juarez [id number] Date: 07/17/2015 Reason for consult: Follow-up assessment;Breast/nipple pain;Difficult latch  Baby 52 hours old. Mom reports that the baby seemed to nurse well for the first day, but then the baby seems to be tired at the breast. Mom reports that her first baby, a 32-year-old, did the same thing. Mom states that she ended up doing a lot of pumping for the first baby and bottle-feeding EBM for "4-6" weeks. Mom states that her nipples are sore and the baby just will not nurse, with or without the NS. Parents have been supplementing the baby with formula at the breast using the NS and curve-tipped syringe. Mom is using inverted shells between feeding attempts.   Assisted mom to latch baby in football position to left breast. Baby fussy at breast and not able to latch after several attempts. Mom able to apply #20 NS and baby latched to breast. However, baby would only suckle once or twice and then stop. Examined baby's mouth with gloved finger and baby chewing finger rather than suckling. Baby appears to have a high palate and baby's tongue humps in the back as baby attempts to suckle. Baby's tongue does not lateralize or elevate well to palate. Discussed assessment with parents and enc mom to discuss with pediatrician. Enc mom to continue put baby to breast, and to offer lots of tummy time. Enc mom to call [facility] office for support, especially if still having difficulty latching the baby after mom's milk comes to volume. Mom aware of OP/BFSG and [place] phone line assistance after D/C. Set mom up with #5 [place] feeding tube and syringe and baby supplemented with 10 ml of formula at breast through #20 NS. Baby not able to keep a seal with NS, and created a lot of sucking noise while on the shield. However, baby transferred 10 ml and tolerated well. Mom complains of nipple pain while baby at breast--with or  without NS.   Pediatrician, Dr. Wilfred Lacy, examined baby and mom asked about baby's tongue. Dr. Wilfred Lacy stated that the baby would not suck his finger right now and to continue working on breastfeeding.   Set mom up with DEBP and mom's EBM began flowing. Mom able to pump 1-2 ml of EBM. Reviewed EBM storage guidelines Plan is for mom to put baby to breast, using NS as needed, and then supplement with EBM/formula according to supplementation guidelines--which were given with review. Enc mom to post-pump after each feeding and discussed EBM storage guidelines. Discussed the need to continue pumping as long as using NS. Enc mom to make OP appointment, and mom declined stating that she would call back after she gets home--d/t the number of appointments she needs to make.  Discussed mom's hypothyroidism and how it can impact milk supply. Enc mom to follow-up with HCP for hormone levels. Mom states that she intends to call her endocrinologist today because she thinks her thyroid level may have been an issue with milk supply with the first child. Discussed assessment and interventions with patient's bedside nurse, Inez Catalina, RN.   Maternal Data    Feeding Feeding Type: Formula Length of feed: 0 min  LATCH Score/Interventions Latch: Repeated attempts needed to sustain latch, nipple held in mouth throughout feeding, stimulation needed to elicit sucking reflex. Intervention(s): Skin to skin Intervention(s): Adjust position;Assist with latch;Breast compression  Audible Swallowing: Spontaneous and intermittent Intervention(s): Skin to skin;Hand expression  Type of Nipple: Everted at  rest and after stimulation Intervention(s): Shells;Hand pump  Comfort (Breast/Nipple): Filling, red/small blisters or bruises, mild/mod discomfort  Problem noted: Mild/Moderate discomfort Interventions (Mild/moderate discomfort): Hand massage;Hand expression  Hold (Positioning): Assistance needed to correctly position infant at  breast and maintain latch. Intervention(s): Support Pillows;Position options;Breastfeeding basics reviewed;Skin to skin  LATCH Score: 7  Lactation Tools Discussed/Used Tools: Nipple Shields;25F feeding tube / Syringe Nipple shield size: 20   Consult Status Consult Status: Follow-up Date: 07/18/15 Follow-up type: In-patient    Inocente Salles 07/17/2015, 10:09 AM

## 2015-07-17 NOTE — Progress Notes (Addendum)
Subjective: Postpartum Day #2: Cesarean Delivery Patient reports incisional pain, tolerating PO and no problems voiding.  Difficulty with breastfeeding.    Objective: Vital signs in last 24 hours: Temp:  [97.8 F (36.6 C)-98.4 F (36.9 C)] 98.3 F (36.8 C) (06/27 0525) Pulse Rate:  [64-76] 64 (06/27 0525) Resp:  [18] 18 (06/27 0525) BP: (95-124)/(56-77) 95/56 mmHg (06/27 0525) SpO2:  [98 %-100 %] 100 % (06/26 1724)  Physical Exam:  General: alert, cooperative and no distress Lochia: appropriate Uterine Fundus: firm Incision: no significant drainage DVT Evaluation: No evidence of DVT seen on physical exam. No cords or calf tenderness. No significant calf/ankle edema.   Recent Labs  07/16/15 0642  HGB 10.2*  HCT 30.7*    Assessment/Plan: Status post Cesarean section. Doing well postoperatively.  Continue current care.  Morene Crocker, CNM 07/17/2015, 8:27 AM

## 2015-07-18 MED ORDER — IBUPROFEN 600 MG PO TABS: 600.0000 mg | ORAL_TABLET | Freq: Four times a day (QID) | ORAL | Status: DC

## 2015-07-18 MED ORDER — SENNOSIDES-DOCUSATE SODIUM 8.6-50 MG PO TABS: 2.0000 | ORAL_TABLET | Freq: Two times a day (BID) | ORAL | Status: DC

## 2015-07-18 MED ORDER — COCONUT OIL OIL: 1.0000 "application " | TOPICAL_OIL | Status: DC | PRN

## 2015-07-18 MED ORDER — LEVOTHYROXINE SODIUM 150 MCG PO TABS: 150.0000 ug | ORAL_TABLET | Freq: Every day | ORAL | Status: AC

## 2015-07-18 MED ORDER — OXYCODONE-ACETAMINOPHEN 5-325 MG PO TABS: 1.0000 | ORAL_TABLET | ORAL | Status: DC | PRN

## 2015-07-18 MED FILL — OXYCODONE/APAP 5/325MG: 5-325 | 5 days supply | Qty: 50 | Fill #0

## 2015-07-18 MED FILL — IBUPROFEN 600 MG TABLET: 600 | 30 days supply | Qty: 120 | Fill #0

## 2015-07-18 NOTE — Discharge Summary (Signed)
Obstetric Discharge Summary Reason for Admission: induction of labor Prenatal Procedures: ultrasound Intrapartum Procedures: cesarean: low cervical, transverse Postpartum Procedures: none Complications-Operative and Postpartum: none HEMOGLOBIN  Date Value Ref Range Status  07/16/2015 10.2* 12.0 - 15.0 g/dL Final   HCT  Date Value Ref Range Status  07/16/2015 30.7* 36.0 - 46.0 % Final   HEMATOCRIT  Date Value Ref Range Status  04/25/2015 37.7 34.0 - 46.6 % Final    Physical Exam:  General: alert, cooperative and no distress Lochia: appropriate Uterine Fundus: firm Incision: no significant drainage DVT Evaluation: No evidence of DVT seen on physical exam. No cords or calf tenderness.  Discharge Diagnoses: Term Pregnancy-delivered  Discharge Information: Date: 07/18/2015 Activity: pelvic rest Diet: routine Medications: PNV, Ibuprofen, Colace, Iron and Percocet Condition: stable Instructions: refer to practice specific booklet Discharge to: home Follow-up Information    Follow up with [facility] In 1 week.   Why:  For wound re-check   Contact information:   [address] [place] [phone]      Newborn Data: Live born female  Birth Weight: 6 lb 14.2 oz (3125 g) APGAR: 8, 9  Home with mother.  Morene Crocker, CNM 07/18/2015, 8:19 AM

## 2015-07-18 NOTE — Progress Notes (Signed)
Subjective: Postpartum Day #3: Cesarean Delivery Patient reports incisional pain, tolerating PO and no problems voiding.    Objective: Vital signs in last 24 hours: Temp:  [98.1 F (36.7 C)-98.6 F (37 C)] 98.6 F (37 C) (06/28 0531) Pulse Rate:  [61-65] 61 (06/28 0531) Resp:  [18] 18 (06/28 0531) BP: (107-109)/(65-66) 109/66 mmHg (06/28 0531)  Physical Exam:  General: alert, cooperative and no distress Lochia: appropriate Uterine Fundus: firm Incision: no significant drainage DVT Evaluation: No evidence of DVT seen on physical exam. No cords or calf tenderness.   Recent Labs  07/16/15 0642  HGB 10.2*  HCT 30.7*    Assessment/Plan: Status post Cesarean section. Doing well postoperatively.  Discharge home with standard precautions and return to clinic in 1 weeks.  Morene Crocker, CNM 07/18/2015, 8:14 AM

## 2015-07-18 NOTE — Lactation Note (Signed)
This note was copied from a baby's chart. Lactation Consultation Note  Follow up visit made prior to discharge.  Mom states she is using the 16 mm nipple shield and feeding tube/syringe at breast.  She reports that feedings are painful.  Mom tried the 20 mm shield but felt it was too big.  Both nipples are cracked.  She has been using coconut oil.  Comfort gels given with instructions.  Discharge instructions given including engorgement treatment.  Mom has been post pumping and obtaining a few mls.  She does have a DEBP at home.  Mom will continue working with breastfeeding and pumping to establish a good milk supply.  She states she will pump and bottle feed if pain does not improve.  Discussed switching to a bottle for supplementation as volume needs increase.  Encouraged to call for feeding assessment prior to discharge.  Lactation outpatient services and support information reviewed and encouraged.  Patient Name: Kiara Juarez [id number] Date: 07/18/2015     Maternal Data    Feeding    LATCH Score/Interventions                      Lactation Tools Discussed/Used     Consult Status      Ave Filter 07/18/2015, 9:15 AM

## 2015-07-20 ENCOUNTER — Ambulatory Visit (INDEPENDENT_AMBULATORY_CARE_PROVIDER_SITE_OTHER): Payer: BLUE CROSS/BLUE SHIELD | Admitting: Obstetrics

## 2015-07-20 ENCOUNTER — Encounter: Payer: Self-pay | Admitting: Obstetrics

## 2015-07-20 NOTE — Progress Notes (Signed)
Subjective:     Kiara Juarez is a 30 y.o. female who presents for a postpartum visit. She is 1 week postpartum following a low cervical transverse Cesarean section. I have fully reviewed the prenatal and intrapartum course. The delivery was at 28 gestational weeks. Outcome: repeat cesarean section, low transverse incision. Anesthesia: spinal. Postpartum course has been normal. Baby's course has been normal. Baby is feeding by breast. Bleeding thin lochia. Bowel function is normal. Bladder function is normal. Patient is not sexually active. Contraception method is abstinence. Postpartum depression screening: negative.  Tobacco, alcohol and substance abuse history reviewed.  Adult immunizations reviewed including TDAP, rubella and varicella.  The following portions of the patient's history were reviewed and updated as appropriate: allergies, current medications, past family history, past medical history, past social history, past surgical history and problem list.  Review of Systems A comprehensive review of systems was negative.   Objective:    There were no vitals taken for this visit.   PE:      General:  Alert and no distress      Breasts:  Soft, NT.      Abdomen:  Soft, NT.  Incision C, D, I.      Extremities:  Mild LE edema.  Assessment:    1 week postpartum.  Doing well.  Plan:    1. Contraception: Options discussed 2. Continue PNV's 3. Follow up in: 4 weeks or as needed.   Healthy lifestyle practices reviewed

## 2015-08-10 MED FILL — SYNTHROID 150 MCG TABLET: 150 | 30 days supply | Qty: 30 | Fill #5

## 2015-08-16 ENCOUNTER — Ambulatory Visit (INDEPENDENT_AMBULATORY_CARE_PROVIDER_SITE_OTHER): Payer: BLUE CROSS/BLUE SHIELD | Admitting: Certified Nurse Midwife

## 2015-08-16 VITALS — BP 121/78 | HR 72 | Temp 97.6°F | Wt 172.9 lb

## 2015-08-16 DIAGNOSIS — IMO0001 Reserved for inherently not codable concepts without codable children: Secondary | ICD-10-CM

## 2015-08-16 DIAGNOSIS — K219 Gastro-esophageal reflux disease without esophagitis: Principal | ICD-10-CM

## 2015-08-16 DIAGNOSIS — Z3009 Encounter for other general counseling and advice on contraception: Secondary | ICD-10-CM

## 2015-08-16 MED ORDER — OMEPRAZOLE 20 MG PO CPDR: 20.0000 mg | DELAYED_RELEASE_CAPSULE | Freq: Two times a day (BID) | ORAL | 5 refills | Status: DC

## 2015-08-16 NOTE — Progress Notes (Signed)
Subjective:     Kiara Juarez is a 30 y.o. female who presents for a postpartum visit. She is 4 weeks postpartum following a low cervical transverse Cesarean section. I have fully reviewed the prenatal and intrapartum course. The delivery was at 4 gestational weeks. Outcome: repeat cesarean section, low transverse incision. Anesthesia: spinal. Postpartum course has been normal. Baby's course has been normal. Baby is feeding by breast. Bleeding staining only. Bowel function is normal. Bladder function is normal. Patient is not sexually active. Contraception method is abstinence. Postpartum depression screening: negative.  Tobacco, alcohol and substance abuse history reviewed.  Adult immunizations reviewed including TDAP, rubella and varicella.  Reports a burning feeling in lower esophagus after eating, no hx of reflux.    The following portions of the patient's history were reviewed and updated as appropriate: allergies, current medications, past family history, past medical history, past social history, past surgical history and problem list.  Review of Systems Pertinent items noted in HPI and remainder of comprehensive ROS otherwise negative.   Objective:    BP 121/78   Pulse 72   Temp 97.6 F (36.4 C)   Wt 172 lb 14.4 oz (78.4 kg)   BMI 30.63 kg/m   General:  alert, cooperative and no distress   Breasts:  inspection negative, no nipple discharge or bleeding, no masses or nodularity palpable  Lungs: clear to auscultation bilaterally  Heart:  regular rate and rhythm, S1, S2 normal, no murmur, click, rub or gallop  Abdomen: soft, non-tender; bowel sounds normal; no masses,  no organomegaly   Vulva:  not evaluated  Vagina: not evaluated  Cervix:  not evaluated  Corpus: normal  Adnexa:  not evaluated  Rectal Exam: Not performed.       C-section wound: well approximated, no s/s infection, no erythema, no open areas   50% of 15 min visit spent on counseling and coordination of care.   Assessment:     normal 4 week postpartum exam. Pap smear not done at today's visit.     Contraception counseling   Reflux   Plan:    1. Contraception: abstinence 2.  F/U in 2 weekf for Mirena IUD placement 3. Follow up in: 2 weeks or as needed.  2hr GTT for h/o GDM/screening for DM q 3 yrs per ADA recommendations Preconception counseling provided Healthy lifestyle practices reviewed

## 2015-08-30 ENCOUNTER — Ambulatory Visit: Payer: BLUE CROSS/BLUE SHIELD | Admitting: Obstetrics

## 2015-09-13 ENCOUNTER — Ambulatory Visit (INDEPENDENT_AMBULATORY_CARE_PROVIDER_SITE_OTHER): Payer: BLUE CROSS/BLUE SHIELD | Admitting: Obstetrics

## 2015-09-13 ENCOUNTER — Encounter: Payer: Self-pay | Admitting: *Deleted

## 2015-09-13 ENCOUNTER — Encounter: Payer: Self-pay | Admitting: Obstetrics

## 2015-09-13 VITALS — BP 127/85 | HR 74 | Temp 97.7°F

## 2015-09-13 DIAGNOSIS — Z01818 Encounter for other preprocedural examination: Secondary | ICD-10-CM

## 2015-09-13 DIAGNOSIS — Z30014 Encounter for initial prescription of intrauterine contraceptive device: Secondary | ICD-10-CM | POA: Diagnosis not present

## 2015-09-13 DIAGNOSIS — Z30430 Encounter for insertion of intrauterine contraceptive device: Secondary | ICD-10-CM | POA: Diagnosis not present

## 2015-09-13 MED FILL — SYNTHROID 150 MCG TABLET: 150 | 30 days supply | Qty: 30 | Fill #1

## 2015-09-13 NOTE — Progress Notes (Signed)
IUD Insertion Procedure Note  Pre-operative Diagnosis: Desires Contraception  Post-operative Diagnosis: same  Indications: contraception  Procedure Details  Urine pregnancy test was done in office and result was negative.  The risks (including infection, bleeding, pain, and uterine perforation) and benefits of the procedure were explained to the patient and Written informed consent was obtained.    Cervix cleansed with Betadine. Uterus sounded to 8 cm. IUD inserted without difficulty. String visible and trimmed. Patient tolerated procedure well.  IUD Information: Mirena, Lot # [id number], Expiration date 75 / 27.  Condition: Stable  Complications: None  Plan:  The patient was advised to call for any fever or for prolonged or severe pain or bleeding. She was advised to use NSAID as needed for mild to moderate pain.   Attending Physician Documentation: I was present for or participated in the entire procedure, including opening and closing.

## 2015-10-25 ENCOUNTER — Ambulatory Visit (INDEPENDENT_AMBULATORY_CARE_PROVIDER_SITE_OTHER): Payer: BLUE CROSS/BLUE SHIELD | Admitting: Obstetrics

## 2015-10-25 ENCOUNTER — Encounter: Payer: Self-pay | Admitting: Obstetrics

## 2015-10-25 VITALS — BP 108/71 | HR 75 | Ht 63.0 in | Wt 175.0 lb

## 2015-10-25 DIAGNOSIS — F53 Postpartum depression: Secondary | ICD-10-CM

## 2015-10-25 DIAGNOSIS — Z30431 Encounter for routine checking of intrauterine contraceptive device: Secondary | ICD-10-CM | POA: Diagnosis not present

## 2015-10-25 DIAGNOSIS — O99345 Other mental disorders complicating the puerperium: Secondary | ICD-10-CM

## 2015-10-25 DIAGNOSIS — Z23 Encounter for immunization: Secondary | ICD-10-CM | POA: Diagnosis not present

## 2015-10-25 NOTE — Progress Notes (Signed)
Some occasional left sided pain.  Still having irregular bleeding, cycles have not regulated yet since her delivery. Has not had intercourse yet. Has had some vaginal odor. No uti symptoms.

## 2015-10-25 NOTE — Progress Notes (Signed)
Subjective:    Kiara Juarez is a 30 y.o. female who presents for 6 week check up after Mirena IUD insertion. The patient has no complaints today. The patient is not currently sexually active. Pertinent past medical history:  Depression.  Feels like some postpartum depression is persisting.  Does not want meds if they can be avoided and counseling started if possible.  The information documented in the HPI was reviewed and verified.  Menstrual History: OB History    Gravida Para Term Preterm AB Living   3 2 2  0 1 2   SAB TAB Ectopic Multiple Live Births   0 0 1 0 2      Obstetric Comments   Patient had ectopic pregnancy and miscarriage was carrying twins.       Patient's last menstrual period was 10/11/2015.   Patient Active Problem List   Diagnosis Date Noted  . Normal labor 07/14/2015  . Encounter for trial of labor 07/14/2015  . S/P cesarean section 09/28/2013  . Herpes zoster 07/28/2013  . UTI (lower urinary tract infection) 03/01/2013  . Acute blood loss anemia 04/03/2012  . Tension type headache 03/11/2011  . Depression   . Hypothyroid 10/17/2006  . MOLE 04/21/2006  . GOITER NOS 04/21/2006   Past Medical History:  Diagnosis Date  . Blood transfusion without reported diagnosis   . Headache(784.0)    otc meds prn  . Hypothyroid   . Thyroid disease     Past Surgical History:  Procedure Laterality Date  . CESAREAN SECTION N/A 09/28/2013   Procedure: CESAREAN SECTION;  Surgeon: Lahoma Crocker, MD;  Location: [facility];  Service: Obstetrics;  Laterality: N/A;  . CESAREAN SECTION    . CESAREAN SECTION N/A 07/15/2015   Procedure: CESAREAN SECTION;  Surgeon: Shelly Bombard, MD;  Location: [facility];  Service: Obstetrics;  Laterality: N/A;  . DILATION AND CURETTAGE OF UTERUS    . DILATION AND EVACUATION N/A 03/17/2012   Procedure: DILATATION AND EVACUATION;  Surgeon: Lahoma Crocker, MD;  Location: [facility];  Service: Gynecology;  Laterality: N/A;  .  LAPAROTOMY N/A 04/02/2012   Procedure: EXPLORATORY LAPAROTOMY;  Surgeon: Lahoma Crocker, MD;  Location: [facility];  Service: Gynecology;  Laterality: N/A;  . UNILATERAL SALPINGECTOMY Left 04/02/2012   Procedure: UNILATERAL SALPINGECTOMY;  Surgeon: Lahoma Crocker, MD;  Location: [facility];  Service: Gynecology;  Laterality: Left;  partial salpingectomy  . WISDOM TOOTH EXTRACTION       Current Outpatient Prescriptions:  .  ibuprofen (ADVIL,MOTRIN) 600 MG tablet, Take 1 tablet (600 mg total) by mouth 4 (four) times daily., Disp: 120 tablet, Rfl: 3 .  levonorgestrel (MIRENA) 20 MCG/24HR IUD, 1 each by Intrauterine route once., Disp: , Rfl:  .  levothyroxine (SYNTHROID, LEVOTHROID) 150 MCG tablet, Take 1 tablet (150 mcg total) by mouth daily before breakfast., Disp: 30 tablet, Rfl: 1 .  Prenatal Vit-Fe Fumarate-FA (MULTIVITAMIN-PRENATAL) 27-0.8 MG TABS tablet, Take 1 tablet by mouth daily at 12 noon. , Disp: , Rfl:  No Known Allergies  Social History  Substance Use Topics  . Smoking status: Former Research scientist (life sciences)  . Smokeless tobacco: Never Used  . Alcohol use No     Comment: Occasional - rarely    Family History  Problem Relation Age of Onset  . Arthritis Maternal Grandmother   . Heart disease Mother   . Diabetes Maternal Grandmother   . Asthma Maternal Grandmother   . Diabetes Maternal Grandfather   . Kidney disease Maternal Grandfather   .  Atrial fibrillation Mother   . Aneurysm      P. Uncle, P. Aunt, P. cousin x 3       Review of Systems Constitutional: negative for weight loss Genitourinary:negative for abnormal menstrual periods and vaginal discharge   Objective:   BP 108/71   Pulse 75   Ht 5\' 3"  (1.6 m)   Wt 175 lb (79.4 kg)   LMP 10/11/2015   BMI 31.00 kg/m    General:   alert  Skin:   no rash or abnormalities  Lungs:   clear to auscultation bilaterally  Heart:   regular rate and rhythm, S1, S2 normal, no murmur, click, rub or gallop  Breasts:   normal without  suspicious masses, skin or nipple changes or axillary nodes  Abdomen:  normal findings: no organomegaly, soft, non-tender and no hernia  Pelvis:  External genitalia: normal general appearance Urinary system: urethral meatus normal and bladder without fullness, nontender Vaginal: normal without tenderness, induration or masses Cervix: normal appearance.  IUD string visible, normal length Adnexa: normal bimanual exam Uterus: anteverted and non-tender, normal size   Lab Review Urine pregnancy test Labs reviewed yes Radiologic studies reviewed no  50% of 15 min visit spent on counseling and coordination of care.   Assessment:    30 y.o., continuing Mirena IUD, no contraindications.    Mild Postpartum Depression.  Plan:    [place] recommended  All questions answered. Discussed healthy lifestyle modifications. Follow up as needed.    Meds ordered this encounter  Medications  . levonorgestrel (MIRENA) 20 MCG/24HR IUD    Sig: 1 each by Intrauterine route once.   No orders of the defined types were placed in this encounter.

## 2015-10-26 MED FILL — SYNTHROID 150 MCG TABLET: 150 | 30 days supply | Qty: 30 | Fill #0

## 2015-11-30 ENCOUNTER — Ambulatory Visit: Payer: BLUE CROSS/BLUE SHIELD | Admitting: Obstetrics

## 2015-12-04 MED FILL — IBUPROFEN 600 MG TABLET: 600 | 30 days supply | Qty: 120 | Fill #1

## 2015-12-04 MED FILL — SYNTHROID 150 MCG TABLET: 150 | 30 days supply | Qty: 30 | Fill #1

## 2015-12-12 ENCOUNTER — Ambulatory Visit: Payer: BLUE CROSS/BLUE SHIELD | Admitting: Obstetrics and Gynecology

## 2016-08-11 ENCOUNTER — Telehealth: Payer: Self-pay | Admitting: *Deleted

## 2016-08-11 ENCOUNTER — Other Ambulatory Visit: Payer: Self-pay | Admitting: Obstetrics

## 2016-08-11 ENCOUNTER — Telehealth: Payer: Self-pay

## 2016-08-11 DIAGNOSIS — E039 Hypothyroidism, unspecified: Secondary | ICD-10-CM

## 2016-08-11 NOTE — Telephone Encounter (Signed)
Returned call, pt states that she has medicaid and they have [place] listed as primary, pt states that she called to get the situation corrected but she has an appt tomorrow with [facility] and has to have referral from [place] since we are still listed as primary, advised that provider would send.

## 2016-08-11 NOTE — Telephone Encounter (Signed)
Pt called in and requesting to speak to Opal Sidles, she has called several times Im not sure want she wants

## 2016-08-21 ENCOUNTER — Encounter: Payer: Self-pay | Admitting: Obstetrics

## 2016-08-21 ENCOUNTER — Ambulatory Visit (INDEPENDENT_AMBULATORY_CARE_PROVIDER_SITE_OTHER): Payer: Medicaid Other | Admitting: Obstetrics

## 2016-08-21 ENCOUNTER — Other Ambulatory Visit (HOSPITAL_COMMUNITY)
Admission: RE | Admit: 2016-08-21 | Discharge: 2016-08-21 | Disposition: A | Discharge status: Home / Self Care | Payer: Medicaid Other | Source: Ambulatory Visit | Attending: Obstetrics | Admitting: Obstetrics

## 2016-08-21 VITALS — BP 116/79 | HR 76 | Wt 154.0 lb

## 2016-08-21 DIAGNOSIS — Z0000 Encounter for general adult medical examination without abnormal findings: Secondary | ICD-10-CM

## 2016-08-21 DIAGNOSIS — Z01419 Encounter for gynecological examination (general) (routine) without abnormal findings: Secondary | ICD-10-CM | POA: Diagnosis not present

## 2016-08-21 DIAGNOSIS — Z30431 Encounter for routine checking of intrauterine contraceptive device: Secondary | ICD-10-CM

## 2016-08-21 DIAGNOSIS — Z8669 Personal history of other diseases of the nervous system and sense organs: Secondary | ICD-10-CM

## 2016-08-21 DIAGNOSIS — N898 Other specified noninflammatory disorders of vagina: Secondary | ICD-10-CM | POA: Diagnosis not present

## 2016-08-21 DIAGNOSIS — R102 Pelvic and perineal pain: Secondary | ICD-10-CM

## 2016-08-21 DIAGNOSIS — N3001 Acute cystitis with hematuria: Secondary | ICD-10-CM

## 2016-08-21 DIAGNOSIS — R300 Dysuria: Secondary | ICD-10-CM

## 2016-08-21 LAB — POCT URINALYSIS DIPSTICK
Bilirubin, UA: NEGATIVE
Blood, UA: 2
GLUCOSE UA: NEGATIVE
Ketones, UA: 1
NITRITE UA: NEGATIVE
SPEC GRAV UA: 1.01 (ref 1.010–1.025)
Urobilinogen, UA: NEGATIVE E.U./dL — AB
pH, UA: 6 (ref 5.0–8.0)

## 2016-08-21 MED ORDER — IBUPROFEN 800 MG PO TABS: 800.0000 mg | ORAL_TABLET | Freq: Three times a day (TID) | ORAL | 5 refills | Status: DC | PRN

## 2016-08-21 MED ORDER — PRENATAL 27-0.8 MG PO TABS: 1.0000 | ORAL_TABLET | Freq: Every day | ORAL | 11 refills | Status: DC

## 2016-08-21 MED ORDER — CEFUROXIME AXETIL 500 MG PO TABS: 500.0000 mg | ORAL_TABLET | Freq: Two times a day (BID) | ORAL | 2 refills | Status: DC

## 2016-08-21 MED ORDER — BUTALBITAL-APAP-CAFFEINE 50-325-40 MG PO TABS: 2.0000 | ORAL_TABLET | Freq: Three times a day (TID) | ORAL | 5 refills | Status: AC | PRN

## 2016-08-21 NOTE — Progress Notes (Signed)
Subjective:        Kiara Juarez is a 31 y.o. female here for a routine exam.  Current complaints: Burning with urination.  Backache and cramping off and on.  Migraines have returned.  Has an appointment with Neurologist for migraines.  Personal health questionnaire:  Is patient Ashkenazi Jewish, have a family history of breast and/or ovarian cancer: no Is there a family history of uterine cancer diagnosed at age < 74, gastrointestinal cancer, urinary tract cancer, family member who is a Field seismologist syndrome-associated carrier: no Is the patient overweight and hypertensive, family history of diabetes, personal history of gestational diabetes, preeclampsia or PCOS: no Is patient over 25, have PCOS,  family history of premature CHD under age 26, diabetes, smoke, have hypertension or peripheral artery disease:  no At any time, has a partner hit, kicked or otherwise hurt or frightened you?: no Over the past 2 weeks, have you felt down, depressed or hopeless?: no Over the past 2 weeks, have you felt little interest or pleasure in doing things?:no   Gynecologic History Patient's last menstrual period was 08/04/2016 (approximate). Contraception: IUD Last Pap: 2016. Results were: normal Last mammogram: n/a. Results were: n/a  Obstetric History OB History  Gravida Para Term Preterm AB Living  3 2 2  0 1 2  SAB TAB Ectopic Multiple Live Births  0 0 1 0 2    # Outcome Date GA Lbr Len/2nd Weight Sex Delivery Anes PTL Lv  3 Term 07/15/15 [redacted]w[redacted]d 22:26 / 12:38 6 lb 14.2 oz (3.125 kg) M CS-LTranv EPI  LIV  2 Term 09/28/13 [redacted]w[redacted]d   F CS-LTranv Spinal  LIV  1 Ectopic 2014     SAB   DEC     Birth Comments: System Generated. Please review and update pregnancy details.    Obstetric Comments  Patient had ectopic pregnancy and miscarriage was carrying twins.     Past Medical History:  Diagnosis Date  . Blood transfusion without reported diagnosis   . Headache(784.0)    otc meds prn  . Hypothyroid    . Thyroid disease     Past Surgical History:  Procedure Laterality Date  . CESAREAN SECTION N/A 09/28/2013   Procedure: CESAREAN SECTION;  Surgeon: Lahoma Crocker, MD;  Location: [facility];  Service: Obstetrics;  Laterality: N/A;  . CESAREAN SECTION    . CESAREAN SECTION N/A 07/15/2015   Procedure: CESAREAN SECTION;  Surgeon: Shelly Bombard, MD;  Location: [facility];  Service: Obstetrics;  Laterality: N/A;  . DILATION AND CURETTAGE OF UTERUS    . DILATION AND EVACUATION N/A 03/17/2012   Procedure: DILATATION AND EVACUATION;  Surgeon: Lahoma Crocker, MD;  Location: [facility];  Service: Gynecology;  Laterality: N/A;  . LAPAROTOMY N/A 04/02/2012   Procedure: EXPLORATORY LAPAROTOMY;  Surgeon: Lahoma Crocker, MD;  Location: [facility];  Service: Gynecology;  Laterality: N/A;  . UNILATERAL SALPINGECTOMY Left 04/02/2012   Procedure: UNILATERAL SALPINGECTOMY;  Surgeon: Lahoma Crocker, MD;  Location: [facility];  Service: Gynecology;  Laterality: Left;  partial salpingectomy  . WISDOM TOOTH EXTRACTION       Current Outpatient Prescriptions:  .  ibuprofen (ADVIL,MOTRIN) 600 MG tablet, Take 1 tablet (600 mg total) by mouth 4 (four) times daily., Disp: 120 tablet, Rfl: 3 .  levonorgestrel (MIRENA) 20 MCG/24HR IUD, 1 each by Intrauterine route once., Disp: , Rfl:  .  levothyroxine (SYNTHROID, LEVOTHROID) 150 MCG tablet, Take 1 tablet (150 mcg total) by mouth daily before breakfast., Disp: 30 tablet,  Rfl: 1 No Known Allergies  Social History  Substance Use Topics  . Smoking status: Former Research scientist (life sciences)  . Smokeless tobacco: Never Used  . Alcohol use No     Comment: Occasional - rarely    Family History  Problem Relation Age of Onset  . Heart disease Mother   . Atrial fibrillation Mother   . Arthritis Maternal Grandmother   . Diabetes Maternal Grandmother   . Asthma Maternal Grandmother   . Diabetes Maternal Grandfather   . Kidney disease Maternal Grandfather   . Aneurysm Unknown         P. Uncle, P. Aunt, P. cousin x 3      Review of Systems  Constitutional: negative for fatigue and weight loss Respiratory: negative for cough and wheezing Cardiovascular: negative for chest pain, fatigue and palpitations Gastrointestinal: negative for abdominal pain and change in bowel habits Musculoskeletal:positive for myalgias - backache and cramping Neurological: positive for migraines Behavioral/Psych: negative for abusive relationship, depression Endocrine: negative for temperature intolerance    Genitourinary:negative for abnormal menstrual periods, genital lesions, hot flashes or sexual problems.  Positive for vaginal discharge and burning with urination Integument/breast: negative for breast lump, breast tenderness, nipple discharge and skin lesion(s)    Objective:       BP 116/79   Pulse 76   Wt 154 lb (69.9 kg)   LMP 08/04/2016 (Approximate)   BMI 27.28 kg/m  General:   alert  Skin:   no rash or abnormalities  Lungs:   clear to auscultation bilaterally  Heart:   regular rate and rhythm, S1, S2 normal, no murmur, click, rub or gallop  Breasts:   normal without suspicious masses, skin or nipple changes or axillary nodes  Abdomen:  normal findings: no organomegaly, soft, non-tender and no hernia  Pelvis:  External genitalia: normal general appearance Urinary system: urethral meatus normal and bladder without fullness. Tender to deep palpation Vaginal: normal without tenderness, induration or masses Cervix: normal appearance Adnexa: normal bimanual exam Uterus: anteverted and non-tender, normal size   Lab Review Urine pregnancy test Labs reviewed yes Radiologic studies reviewed no  50% of 20 min visit spent on counseling and coordination of care.    Assessment:     1. Encounter for routine gynecological examination with Papanicolaou smear of cervix Rx: - Cytology - PAP - Cervicovaginal ancillary only - Prenatal Vit-Fe Fumarate-FA (MULTIVITAMIN-PRENATAL)  27-0.8 MG TABS tablet; Take 1 tablet by mouth daily before breakfast.  Dispense: 30 each; Refill: 11  2. Encounter for routine checking of intrauterine contraceptive device (IUD) - Doing well  3. Dysuria Rx: - POCT urinalysis dipstick - Urine Culture - cefUROXime (CEFTIN) 500 MG tablet; Take 1 tablet (500 mg total) by mouth 2 (two) times daily with a meal.  Dispense: 14 tablet; Refill: 2  4. Pelvic pain in female Rx: - ibuprofen (ADVIL,MOTRIN) 800 MG tablet; Take 1 tablet (800 mg total) by mouth every 8 (eight) hours as needed.  Dispense: 30 tablet; Refill: 5  5. Vaginal discharge Rx: - Cervicovaginal ancillary only  6. Hx of migraines Rx: - butalbital-acetaminophen-caffeine (ESGIC) 50-325-40 MG tablet; Take 2 tablets by mouth every 8 (eight) hours as needed for headache.  Dispense: 30 tablet; Refill: 5   Plan:    Education reviewed: calcium supplements, depression evaluation, low fat, low cholesterol diet, safe sex/STD prevention, self breast exams and weight bearing exercise. Contraception: IUD. Follow up in: 1 year.   No orders of the defined types were placed in this encounter.  Orders Placed This Encounter  Procedures  . Urine Culture  . POCT urinalysis dipstick      Patient is in the office for annual exam- patient is having  burning with urination.  Patient is also having cramping on and off.

## 2016-08-21 NOTE — Progress Notes (Addendum)
Subjective:        Kiara Juarez is a 31 y.o. female here for a routine exam.  Current complaints: Burning with urination.  Occasional cramping..    Personal health questionnaire:  Is patient Ashkenazi Jewish, have a family history of breast and/or ovarian cancer: no Is there a family history of uterine cancer diagnosed at age < 37, gastrointestinal cancer, urinary tract cancer, family member who is a Field seismologist syndrome-associated carrier: no Is the patient overweight and hypertensive, family history of diabetes, personal history of gestational diabetes, preeclampsia or PCOS: no Is patient over 70, have PCOS,  family history of premature CHD under age 5, diabetes, smoke, have hypertension or peripheral artery disease:  no At any time, has a partner hit, kicked or otherwise hurt or frightened you?: no Over the past 2 weeks, have you felt down, depressed or hopeless?: no Over the past 2 weeks, have you felt little interest or pleasure in doing things?:no   Gynecologic History Patient's last menstrual period was 08/04/2016 (approximate). Contraception: IUD Last Pap: 2017. Results were: normal Last mammogram: n/a. Results were: n/a  Obstetric History OB History  Gravida Para Term Preterm AB Living  3 2 2  0 1 2  SAB TAB Ectopic Multiple Live Births  0 0 1 0 2    # Outcome Date GA Lbr Len/2nd Weight Sex Delivery Anes PTL Lv  3 Term 07/15/15 [redacted]w[redacted]d 22:26 / 12:38 6 lb 14.2 oz (3.125 kg) M CS-LTranv EPI  LIV  2 Term 09/28/13 [redacted]w[redacted]d   F CS-LTranv Spinal  LIV  1 Ectopic 2014     SAB   DEC     Birth Comments: System Generated. Please review and update pregnancy details.    Obstetric Comments  Patient had ectopic pregnancy and miscarriage was carrying twins.     Past Medical History:  Diagnosis Date  . Blood transfusion without reported diagnosis   . Headache(784.0)    otc meds prn  . Hypothyroid   . Thyroid disease     Past Surgical History:  Procedure Laterality Date  .  CESAREAN SECTION N/A 09/28/2013   Procedure: CESAREAN SECTION;  Surgeon: Lahoma Crocker, MD;  Location: [facility];  Service: Obstetrics;  Laterality: N/A;  . CESAREAN SECTION    . CESAREAN SECTION N/A 07/15/2015   Procedure: CESAREAN SECTION;  Surgeon: Shelly Bombard, MD;  Location: [facility];  Service: Obstetrics;  Laterality: N/A;  . DILATION AND CURETTAGE OF UTERUS    . DILATION AND EVACUATION N/A 03/17/2012   Procedure: DILATATION AND EVACUATION;  Surgeon: Lahoma Crocker, MD;  Location: [facility];  Service: Gynecology;  Laterality: N/A;  . LAPAROTOMY N/A 04/02/2012   Procedure: EXPLORATORY LAPAROTOMY;  Surgeon: Lahoma Crocker, MD;  Location: [facility];  Service: Gynecology;  Laterality: N/A;  . UNILATERAL SALPINGECTOMY Left 04/02/2012   Procedure: UNILATERAL SALPINGECTOMY;  Surgeon: Lahoma Crocker, MD;  Location: [facility];  Service: Gynecology;  Laterality: Left;  partial salpingectomy  . WISDOM TOOTH EXTRACTION       Current Outpatient Prescriptions:  .  ibuprofen (ADVIL,MOTRIN) 600 MG tablet, Take 1 tablet (600 mg total) by mouth 4 (four) times daily., Disp: 120 tablet, Rfl: 3 .  levonorgestrel (MIRENA) 20 MCG/24HR IUD, 1 each by Intrauterine route once., Disp: , Rfl:  .  levothyroxine (SYNTHROID, LEVOTHROID) 150 MCG tablet, Take 1 tablet (150 mcg total) by mouth daily before breakfast., Disp: 30 tablet, Rfl: 1 .  butalbital-acetaminophen-caffeine (ESGIC) 50-325-40 MG tablet, Take 2 tablets by mouth  every 8 (eight) hours as needed for headache., Disp: 30 tablet, Rfl: 5 .  cefUROXime (CEFTIN) 500 MG tablet, Take 1 tablet (500 mg total) by mouth 2 (two) times daily with a meal., Disp: 14 tablet, Rfl: 2 .  ibuprofen (ADVIL,MOTRIN) 800 MG tablet, Take 1 tablet (800 mg total) by mouth every 8 (eight) hours as needed., Disp: 30 tablet, Rfl: 5 .  Prenatal Vit-Fe Fumarate-FA (MULTIVITAMIN-PRENATAL) 27-0.8 MG TABS tablet, Take 1 tablet by mouth daily before breakfast., Disp: 30 each,  Rfl: 11 No Known Allergies  Social History  Substance Use Topics  . Smoking status: Former Research scientist (life sciences)  . Smokeless tobacco: Never Used  . Alcohol use No     Comment: Occasional - rarely    Family History  Problem Relation Age of Onset  . Heart disease Mother   . Atrial fibrillation Mother   . Arthritis Maternal Grandmother   . Diabetes Maternal Grandmother   . Asthma Maternal Grandmother   . Diabetes Maternal Grandfather   . Kidney disease Maternal Grandfather   . Aneurysm Unknown        P. Uncle, P. Aunt, P. cousin x 3      Review of Systems  Constitutional: negative for fatigue and weight loss Respiratory: negative for cough and wheezing Cardiovascular: negative for chest pain, fatigue and palpitations Gastrointestinal: negative for abdominal pain and change in bowel habits Musculoskeletal:negative for myalgias Neurological: negative for gait problems and tremors Behavioral/Psych: negative for abusive relationship, depression Endocrine: negative for temperature intolerance    Genitourinary:negative for abnormal menstrual periods, genital lesions, hot flashes, sexual problems and vaginal discharge Integument/breast: negative for breast lump, breast tenderness, nipple discharge and skin lesion(s)    Objective:       BP 116/79   Pulse 76   Wt 154 lb (69.9 kg)   LMP 08/04/2016 (Approximate)   BMI 27.28 kg/m  General:   alert  Skin:   no rash or abnormalities  Lungs:   clear to auscultation bilaterally  Heart:   regular rate and rhythm, S1, S2 normal, no murmur, click, rub or gallop  Breasts:   normal without suspicious masses, skin or nipple changes or axillary nodes  Abdomen:  normal findings: no organomegaly, soft, non-tender and no hernia  Pelvis:  External genitalia: normal general appearance Urinary system: urethral meatus normal and bladder without fullness, nontender Vaginal: normal without tenderness, induration or masses Cervix: normal appearance Adnexa:  normal bimanual exam Uterus: anteverted and non-tender, normal size   Lab Review Urine pregnancy test Labs reviewed yes Radiologic studies reviewed no  50% of 20 min visit spent on counseling and coordination of care.    Assessment and Plan:    .1. Encounter for routine gynecological examination with Papanicolaou smear of cervix Rx: - Cytology - PAP - Cervicovaginal ancillary only - Prenatal Vit-Fe Fumarate-FA (MULTIVITAMIN-PRENATAL) 27-0.8 MG TABS tablet; Take 1 tablet by mouth daily before breakfast.  Dispense: 30 each; Refill: 11  2. Encounter for routine checking of intrauterine contraceptive device (IUD) - Doing well.  Continue IUD.  3. Dysuria Rx: - POCT urinalysis dipstick - Urine Culture - cefUROXime (CEFTIN) 500 MG tablet; Take 1 tablet (500 mg total) by mouth 2 (two) times daily with a meal.  Dispense: 14 tablet; Refill: 2  4. Pelvic pain in female Rx: - ibuprofen (ADVIL,MOTRIN) 800 MG tablet; Take 1 tablet (800 mg total) by mouth every 8 (eight) hours as needed.  Dispense: 30 tablet; Refill: 5  5. Vaginal discharge Rx: - Cervicovaginal ancillary  only  6. Hx of migraines Rx: - butalbital-acetaminophen-caffeine (ESGIC) 50-325-40 MG tablet; Take 2 tablets by mouth every 8 (eight) hours as needed for headache.  Dispense: 30 tablet; Refill: 5   Plan:    Education reviewed: calcium supplements, depression evaluation, low fat, low cholesterol diet, safe sex/STD prevention, self breast exams and weight bearing exercise. Contraception: IUD. Follow up in: 1 year.   Meds ordered this encounter  Medications  . Prenatal Vit-Fe Fumarate-FA (MULTIVITAMIN-PRENATAL) 27-0.8 MG TABS tablet    Sig: Take 1 tablet by mouth daily before breakfast.    Dispense:  30 each    Refill:  11  . ibuprofen (ADVIL,MOTRIN) 800 MG tablet    Sig: Take 1 tablet (800 mg total) by mouth every 8 (eight) hours as needed.    Dispense:  30 tablet    Refill:  5  .  butalbital-acetaminophen-caffeine (ESGIC) 50-325-40 MG tablet    Sig: Take 2 tablets by mouth every 8 (eight) hours as needed for headache.    Dispense:  30 tablet    Refill:  5  . cefUROXime (CEFTIN) 500 MG tablet    Sig: Take 1 tablet (500 mg total) by mouth 2 (two) times daily with a meal.    Dispense:  14 tablet    Refill:  2   Orders Placed This Encounter  Procedures  . Urine Culture  . POCT urinalysis dipstick      Patient ID: ALLISYN KUNZ, female   DOB: 03/20/1985, 31 y.o.   MRN: [id number]

## 2016-08-22 LAB — CERVICOVAGINAL ANCILLARY ONLY
BACTERIAL VAGINITIS: NEGATIVE
CANDIDA VAGINITIS: NEGATIVE
Chlamydia: NEGATIVE
Neisseria Gonorrhea: NEGATIVE
TRICH (WINDOWPATH): NEGATIVE

## 2016-08-23 ENCOUNTER — Other Ambulatory Visit: Payer: Self-pay | Admitting: Obstetrics

## 2016-08-23 DIAGNOSIS — N3001 Acute cystitis with hematuria: Secondary | ICD-10-CM

## 2016-08-23 LAB — URINE CULTURE

## 2016-08-23 MED ORDER — AMOXICILLIN-POT CLAVULANATE 875-125 MG PO TABS: 1.0000 | ORAL_TABLET | Freq: Two times a day (BID) | ORAL | 0 refills | Status: DC

## 2016-08-25 MED ORDER — AMOXICILLIN-POT CLAVULANATE 875-125 MG PO TABS: 1.0000 | ORAL_TABLET | Freq: Two times a day (BID) | ORAL | 0 refills | Status: DC

## 2016-08-25 NOTE — Addendum Note (Signed)
Addended by: Baltazar Najjar A on: 08/25/2016 09:45 AM   Modules accepted: Orders

## 2016-08-26 LAB — CYTOLOGY - PAP
DIAGNOSIS: NEGATIVE
HPV (WINDOPATH): NOT DETECTED

## 2016-08-28 ENCOUNTER — Telehealth: Payer: Self-pay | Admitting: *Deleted

## 2016-08-28 NOTE — Telephone Encounter (Signed)
Pt called to office for pap results Results given, pt has no other questions.

## 2016-10-21 ENCOUNTER — Ambulatory Visit: Payer: BLUE CROSS/BLUE SHIELD | Admitting: Neurology
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
## 2017-08-11 ENCOUNTER — Ambulatory Visit (HOSPITAL_COMMUNITY)
Admission: EM | Admit: 2017-08-11 | Discharge: 2017-08-11 | Disposition: A | Discharge status: Home / Self Care | Payer: Self-pay | Attending: Family Medicine | Admitting: Family Medicine

## 2017-08-11 ENCOUNTER — Encounter (HOSPITAL_COMMUNITY): Payer: Self-pay | Admitting: Emergency Medicine

## 2017-08-11 ENCOUNTER — Other Ambulatory Visit: Payer: Self-pay

## 2017-08-11 DIAGNOSIS — E039 Hypothyroidism, unspecified: Secondary | ICD-10-CM | POA: Insufficient documentation

## 2017-08-11 DIAGNOSIS — R5383 Other fatigue: Secondary | ICD-10-CM

## 2017-08-11 DIAGNOSIS — Z79899 Other long term (current) drug therapy: Secondary | ICD-10-CM | POA: Insufficient documentation

## 2017-08-11 DIAGNOSIS — G44209 Tension-type headache, unspecified, not intractable: Secondary | ICD-10-CM

## 2017-08-11 DIAGNOSIS — R51 Headache: Secondary | ICD-10-CM

## 2017-08-11 DIAGNOSIS — M542 Cervicalgia: Secondary | ICD-10-CM | POA: Insufficient documentation

## 2017-08-11 DIAGNOSIS — Z79890 Hormone replacement therapy (postmenopausal): Secondary | ICD-10-CM | POA: Insufficient documentation

## 2017-08-11 DIAGNOSIS — G44201 Tension-type headache, unspecified, intractable: Secondary | ICD-10-CM | POA: Insufficient documentation

## 2017-08-11 DIAGNOSIS — Z87891 Personal history of nicotine dependence: Secondary | ICD-10-CM | POA: Insufficient documentation

## 2017-08-11 DIAGNOSIS — Z8249 Family history of ischemic heart disease and other diseases of the circulatory system: Secondary | ICD-10-CM | POA: Insufficient documentation

## 2017-08-11 DIAGNOSIS — F329 Major depressive disorder, single episode, unspecified: Secondary | ICD-10-CM | POA: Insufficient documentation

## 2017-08-11 DIAGNOSIS — R200 Anesthesia of skin: Secondary | ICD-10-CM | POA: Insufficient documentation

## 2017-08-11 DIAGNOSIS — Z833 Family history of diabetes mellitus: Secondary | ICD-10-CM | POA: Insufficient documentation

## 2017-08-11 LAB — POCT I-STAT, CHEM 8
BUN: 16 mg/dL (ref 6–20)
CREATININE: 0.7 mg/dL (ref 0.44–1.00)
Calcium, Ion: 1.15 mmol/L (ref 1.15–1.40)
Chloride: 103 mmol/L (ref 98–111)
Glucose, Bld: 93 mg/dL (ref 70–99)
HCT: 44 % (ref 36.0–46.0)
HEMOGLOBIN: 15 g/dL (ref 12.0–15.0)
Potassium: 4.1 mmol/L (ref 3.5–5.1)
Sodium: 139 mmol/L (ref 135–145)
TCO2: 25 mmol/L (ref 22–32)

## 2017-08-11 LAB — POCT URINALYSIS DIP (DEVICE)
Bilirubin Urine: NEGATIVE
Glucose, UA: NEGATIVE mg/dL
Ketones, ur: NEGATIVE mg/dL
Leukocytes, UA: NEGATIVE
NITRITE: NEGATIVE
PH: 6.5 (ref 5.0–8.0)
PROTEIN: NEGATIVE mg/dL
SPECIFIC GRAVITY, URINE: 1.01 (ref 1.005–1.030)
UROBILINOGEN UA: 0.2 mg/dL (ref 0.0–1.0)

## 2017-08-11 LAB — TSH: TSH: 22.025 u[IU]/mL — ABNORMAL HIGH (ref 0.350–4.500)

## 2017-08-11 MED ORDER — IBUPROFEN 800 MG PO TABS: 800.0000 mg | ORAL_TABLET | Freq: Three times a day (TID) | ORAL | 0 refills | Status: AC

## 2017-08-11 NOTE — ED Triage Notes (Signed)
The patient presented to the [facility] with a complaint of a headache with pain radiating down her neck x 4 days.

## 2017-08-11 NOTE — ED Notes (Signed)
Pt discharged by provider.

## 2017-08-11 NOTE — ED Provider Notes (Signed)
[place]    CSN: [id number] Arrival date & time: 08/11/17  1651     History   Chief Complaint Chief Complaint  Patient presents with  . Headache    HPI Kiara Juarez is a 32 y.o. female history of hypothyroid, headaches, presenting today for evaluation of headache and neck pain.  Patient states that she has had headaches off and on since Saturday, there is associated with neck pain that will radiate into her right arm as well as a numbness sensation that is felt throughout all 5 fingers.  States that she has had the numb sensation off and on for many months now, but typically does not have any association with the headache.  She is presenting today as earlier today she had a sensation of feeling very fatigued, lightheaded as if she is about to pass out, shortness of breath.  Has felt weak.  Patient's main concern is that brain aneurysms run in her family.  She states that she has previously been seen by neurology, but denies previous imaging of her brain.  Patient also notes that she has had difficulty seeing far away for a while and is concerned that the symptoms could be related to her vision.  LMP began today  HPI  Past Medical History:  Diagnosis Date  . Blood transfusion without reported diagnosis   . Headache(784.0)    otc meds prn  . Hypothyroid   . Thyroid disease     Patient Active Problem List   Diagnosis Date Noted  . Normal labor 07/14/2015  . Encounter for trial of labor 07/14/2015  . S/P cesarean section 09/28/2013  . Herpes zoster 07/28/2013  . UTI (lower urinary tract infection) 03/01/2013  . Acute blood loss anemia 04/03/2012  . Tension type headache 03/11/2011  . Depression   . Hypothyroid 10/17/2006  . MOLE 04/21/2006  . GOITER NOS 04/21/2006    Past Surgical History:  Procedure Laterality Date  . CESAREAN SECTION N/A 09/28/2013   Procedure: CESAREAN SECTION;  Surgeon: Lahoma Crocker, MD;  Location: [facility];  Service: Obstetrics;   Laterality: N/A;  . CESAREAN SECTION    . CESAREAN SECTION N/A 07/15/2015   Procedure: CESAREAN SECTION;  Surgeon: Shelly Bombard, MD;  Location: [facility];  Service: Obstetrics;  Laterality: N/A;  . DILATION AND CURETTAGE OF UTERUS    . DILATION AND EVACUATION N/A 03/17/2012   Procedure: DILATATION AND EVACUATION;  Surgeon: Lahoma Crocker, MD;  Location: [facility];  Service: Gynecology;  Laterality: N/A;  . LAPAROTOMY N/A 04/02/2012   Procedure: EXPLORATORY LAPAROTOMY;  Surgeon: Lahoma Crocker, MD;  Location: [facility];  Service: Gynecology;  Laterality: N/A;  . UNILATERAL SALPINGECTOMY Left 04/02/2012   Procedure: UNILATERAL SALPINGECTOMY;  Surgeon: Lahoma Crocker, MD;  Location: [facility];  Service: Gynecology;  Laterality: Left;  partial salpingectomy  . WISDOM TOOTH EXTRACTION      OB History    Gravida  3   Para  2   Term  2   Preterm  0   AB  1   Living  2     SAB  0   TAB  0   Ectopic  1   Multiple  0   Live Births  2        Obstetric Comments  Patient had ectopic pregnancy and miscarriage was carrying twins.          Home Medications    Prior to Admission medications   Medication Sig Start  Date End Date Taking? Authorizing Provider  butalbital-acetaminophen-caffeine [facility]) 50-325-40 MG tablet Take 2 tablets by mouth every 8 (eight) hours as needed for headache. 08/21/16  Yes Shelly Bombard, MD  levonorgestrel (MIRENA) 20 MCG/24HR IUD 1 each by Intrauterine route once.   Yes [provider]  levothyroxine (SYNTHROID, LEVOTHROID) 150 MCG tablet Take 1 tablet (150 mcg total) by mouth daily before breakfast. 07/18/15  Yes Denney, Rachelle A, CNM  ibuprofen (ADVIL,MOTRIN) 800 MG tablet Take 1 tablet (800 mg total) by mouth 3 (three) times daily. 08/11/17   Alois Mincer, Elesa Hacker, PA-C    Family History Family History  Problem Relation Age of Onset  . Heart disease Mother   . Atrial fibrillation Mother   . Arthritis Maternal Grandmother     . Diabetes Maternal Grandmother   . Asthma Maternal Grandmother   . Diabetes Maternal Grandfather   . Kidney disease Maternal Grandfather   . Aneurysm Unknown        P. Uncle, P. Aunt, P. cousin x 3    Social History Social History   Tobacco Use  . Smoking status: Former Research scientist (life sciences)  . Smokeless tobacco: Never Used  Substance Use Topics  . Alcohol use: No    Alcohol/week: 0.0 oz    Comment: Occasional - rarely  . Drug use: No     Allergies   Patient has no known allergies.   Review of Systems Review of Systems  Constitutional: Positive for fatigue. Negative for fever.  HENT: Negative for congestion, sinus pressure and sore throat.   Eyes: Negative for photophobia, pain and visual disturbance.  Respiratory: Negative for cough and shortness of breath.   Cardiovascular: Negative for chest pain.  Gastrointestinal: Negative for abdominal pain, nausea and vomiting.  Genitourinary: Negative for decreased urine volume and hematuria.  Musculoskeletal: Positive for myalgias, neck pain and neck stiffness.  Neurological: Positive for light-headedness, numbness and headaches. Negative for dizziness, syncope, facial asymmetry, speech difficulty and weakness.     Physical Exam Triage Vital Signs ED Triage Vitals  Enc Vitals Group     BP 08/11/17 1726 133/87     Pulse Rate 08/11/17 1726 94     Resp 08/11/17 1726 16     Temp 08/11/17 1726 99.2 F (37.3 C)     Temp Source 08/11/17 1726 Oral     SpO2 08/11/17 1726 100 %     Weight --      Height --      Head Circumference --      Peak Flow --      Pain Score 08/11/17 1725 6     Pain Loc --      Pain Edu? --      Excl. in [place]? --    Orthostatic VS for the past 24 hrs:  BP- Lying Pulse- Lying BP- Sitting Pulse- Sitting BP- Standing at 0 minutes Pulse- Standing at 0 minutes  08/11/17 1835 117/81 70 114/76 78 119/85 82    Updated Vital Signs BP 133/87 (BP Location: Left Arm)   Pulse 94   Temp 99.2 F (37.3 C) (Oral)   Resp  16   LMP 08/11/2017   SpO2 100%   Visual Acuity Right Eye Distance:   Left Eye Distance:   Bilateral Distance:    Right Eye Near:   Left Eye Near:    Bilateral Near:     Physical Exam  Constitutional: She appears well-developed and well-nourished. No distress.  No acute distress  HENT:  Head:  Normocephalic and atraumatic.  Mouth/Throat: Oropharynx is clear and moist.  Eyes: Pupils are equal, round, and reactive to light. Conjunctivae and EOM are normal.  Neck: Neck supple.  Cardiovascular: Normal rate and regular rhythm.  No murmur heard. Pulmonary/Chest: Effort normal and breath sounds normal. No respiratory distress.  Abdominal: Soft. There is no tenderness.  Musculoskeletal: She exhibits no edema.  Easily ambulates from chair to exam table  Neurological: She is alert.  Patient A&O x3, cranial nerves II-XII grossly intact, strength at shoulders, hips and knees 5/5, equal bilaterally, patellar and biceps reflex 2+ bilaterally. Normal Finger to nose, RAM and heel to shin. Negative Romberg and Pronator Drift. Gait without abnormality.  Sensation intact on the distal right hand  Skin: Skin is warm and dry.  Psychiatric: She has a normal mood and affect.  Nursing note and vitals reviewed.    UC Treatments / Results  Labs (all labs ordered are listed, but only abnormal results are displayed) Labs Reviewed  POCT URINALYSIS DIP (DEVICE) - Abnormal; Notable for the following components:      Result Value   Hgb urine dipstick MODERATE (*)    All other components within normal limits  TSH  POCT I-STAT, CHEM 8    EKG None  Radiology No results found.  Procedures Procedures (including critical care time)  Medications Ordered in UC Medications - No data to display  Initial Impression / Assessment and Plan / UC Course  I have reviewed the triage vital signs and the nursing notes.  Pertinent labs & imaging results that were available during my care of the patient were  reviewed by me and considered in my medical decision making (see chart for details).     Patient with symptoms likely related to tension type headache.  No focal neuro deficits, vital signs stable, orthostatics negative.  I-STAT unremarkable.  Also check TSH.  Patient has not had this checked in while.  UA unremarkable, moderate hemoglobin likely related to starting cycle.  Will have patient follow-up with PCP/neurology outpatient for further evaluation and work-up of symptoms, go to emergency room if symptoms worsening, headache persisting despite use of anti-inflammatories or developing weakness.Discussed strict return precautions. Patient verbalized understanding and is agreeable with plan.  Final Clinical Impressions(s) / UC Diagnoses   Final diagnoses:  Acute non intractable tension-type headache     Discharge Instructions     Use anti-inflammatories for pain/swelling. You may take up to 800 mg Ibuprofen every 8 hours with food. You may supplement Ibuprofen with Tylenol [phone] mg every 8 hours  Please follow-up with your primary care for further evaluation We will call you if your thyroid level comes back abnormal  Please go to emergency room if headache persisting without improvement with medications, vision changes, worsening lightheadedness, pressure sensation, weakness.   ED Prescriptions    Medication Sig Dispense Auth. Provider   ibuprofen (ADVIL,MOTRIN) 800 MG tablet Take 1 tablet (800 mg total) by mouth 3 (three) times daily. 21 tablet Arul Farabee, Clyde C, PA-C     Controlled Substance Prescriptions [place] Controlled Substance Registry consulted? Not Applicable   Janith Lima, [place] 08/11/17 1909

## 2017-08-11 NOTE — Discharge Instructions (Signed)
Use anti-inflammatories for pain/swelling. You may take up to 800 mg Ibuprofen every 8 hours with food. You may supplement Ibuprofen with Tylenol [phone] mg every 8 hours  Please follow-up with your primary care for further evaluation We will call you if your thyroid level comes back abnormal  Please go to emergency room if headache persisting without improvement with medications, vision changes, worsening lightheadedness, pressure sensation, weakness.

## 2017-08-15 ENCOUNTER — Telehealth (HOSPITAL_COMMUNITY): Payer: Self-pay

## 2017-08-15 NOTE — Telephone Encounter (Signed)
Pt aware of results and need to see PCP

## 2017-08-15 NOTE — Telephone Encounter (Signed)
TSH is elevated. Per Dr. Meda Coffee patient needs to follow up with PCP. Attempted to reach patient. No answer at this time. Voicemail left.
